# Patient Record
Sex: Male | Born: 1975 | Race: Black or African American | Hispanic: No | Marital: Married | Smoking: Never smoker
Health system: Southern US, Community
[De-identification: ages and names within clinical notes are randomized; demographics above are authoritative.]

## PROBLEM LIST (undated history)

## (undated) DIAGNOSIS — K219 Gastro-esophageal reflux disease without esophagitis: Secondary | ICD-10-CM

## (undated) DIAGNOSIS — R7302 Impaired glucose tolerance (oral): Secondary | ICD-10-CM

## (undated) DIAGNOSIS — M545 Low back pain, unspecified: Secondary | ICD-10-CM

## (undated) DIAGNOSIS — E538 Deficiency of other specified B group vitamins: Secondary | ICD-10-CM

## (undated) HISTORY — DX: Impaired glucose tolerance (oral): R73.02

## (undated) HISTORY — DX: Low back pain, unspecified: M54.50

## (undated) HISTORY — DX: Deficiency of other specified B group vitamins: E53.8

## (undated) HISTORY — DX: Gastro-esophageal reflux disease without esophagitis: K21.9

## (undated) HISTORY — DX: Low back pain: M54.5

---

## 2006-04-05 ENCOUNTER — Ambulatory Visit: Payer: Self-pay | Admitting: Internal Medicine

## 2006-04-05 LAB — CONVERTED CEMR LAB
ALT: 34 units/L (ref 0–40)
AST: 22 units/L (ref 0–37)
Albumin: 4.4 g/dL (ref 3.5–5.2)
Alkaline Phosphatase: 40 units/L (ref 39–117)
BUN: 9 mg/dL (ref 6–23)
Basophils Absolute: 0 10*3/uL (ref 0.0–0.1)
Basophils Relative: 0.8 % (ref 0.0–1.0)
Bilirubin Urine: NEGATIVE
CO2: 29 meq/L (ref 19–32)
Calcium: 9.7 mg/dL (ref 8.4–10.5)
Chloride: 105 meq/L (ref 96–112)
Chol/HDL Ratio, serum: 3.5
Cholesterol: 154 mg/dL (ref 0–200)
Creatinine, Ser: 1.1 mg/dL (ref 0.4–1.5)
Eosinophil percent: 2.3 % (ref 0.0–5.0)
GFR calc non Af Amer: 83 mL/min
Glomerular Filtration Rate, Af Am: 100 mL/min/{1.73_m2}
Glucose, Bld: 103 mg/dL — ABNORMAL HIGH (ref 70–99)
HCT: 44.5 % (ref 39.0–52.0)
HDL: 43.9 mg/dL (ref >39.0)
Hemoglobin, Urine: NEGATIVE
Hemoglobin: 14.6 g/dL (ref 13.0–17.0)
Ketones, ur: NEGATIVE mg/dL
LDL Cholesterol: 101 mg/dL — ABNORMAL HIGH (ref 0–99)
Leukocytes, UA: NEGATIVE
Lymphocytes Relative: 43.7 % (ref 12.0–46.0)
MCHC: 32.7 g/dL (ref 30.0–36.0)
MCV: 86.6 fL (ref 78.0–100.0)
Monocytes Absolute: 0.3 10*3/uL (ref 0.2–0.7)
Monocytes Relative: 4.8 % (ref 3.0–11.0)
Neutro Abs: 2.6 10*3/uL (ref 1.4–7.7)
Neutrophils Relative %: 48.4 % (ref 43.0–77.0)
Nitrite: NEGATIVE
Platelets: 284 10*3/uL (ref 150–400)
Potassium: 4 meq/L (ref 3.5–5.1)
RBC: 5.14 M/uL (ref 4.22–5.81)
RDW: 12.4 % (ref 11.5–14.6)
Sodium: 139 meq/L (ref 135–145)
Specific Gravity, Urine: 1.02 (ref 1.000–1.03)
TSH: 1.05 microintl units/mL (ref 0.35–5.50)
Total Bilirubin: 1.1 mg/dL (ref 0.3–1.2)
Total Protein, Urine: NEGATIVE mg/dL
Total Protein: 7.6 g/dL (ref 6.0–8.3)
Triglyceride fasting, serum: 44 mg/dL (ref 0–149)
Urine Glucose: NEGATIVE mg/dL
Urobilinogen, UA: 1 (ref 0.0–1.0)
VLDL: 9 mg/dL (ref 0–40)
WBC: 5.4 10*3/uL (ref 4.5–10.5)
pH: 7.5 (ref 5.0–8.0)

## 2006-04-25 ENCOUNTER — Ambulatory Visit: Payer: Self-pay | Admitting: Internal Medicine

## 2007-03-12 ENCOUNTER — Encounter: Payer: Self-pay | Admitting: Internal Medicine

## 2007-03-12 DIAGNOSIS — M545 Low back pain, unspecified: Secondary | ICD-10-CM | POA: Insufficient documentation

## 2007-03-12 DIAGNOSIS — J309 Allergic rhinitis, unspecified: Secondary | ICD-10-CM | POA: Insufficient documentation

## 2007-05-17 ENCOUNTER — Ambulatory Visit: Payer: Self-pay | Admitting: Internal Medicine

## 2007-05-17 LAB — CONVERTED CEMR LAB
ALT: 46 units/L (ref 0–53)
AST: 28 units/L (ref 0–37)
Basophils Absolute: 0 10*3/uL (ref 0.0–0.1)
Basophils Relative: 0.2 % (ref 0.0–1.0)
Bilirubin Urine: NEGATIVE
CO2: 30 meq/L (ref 19–32)
Creatinine, Ser: 1 mg/dL (ref 0.4–1.5)
Eosinophils Absolute: 0.1 10*3/uL (ref 0.0–0.6)
Eosinophils Relative: 1.6 % (ref 0.0–5.0)
HCT: 42 % (ref 39.0–52.0)
HDL: 42.7 mg/dL (ref >39.0)
Hemoglobin: 14.5 g/dL (ref 13.0–17.0)
LDL Cholesterol: 93 mg/dL (ref 0–99)
Leukocytes, UA: NEGATIVE
Lymphocytes Relative: 39 % (ref 12.0–46.0)
MCHC: 34.6 g/dL (ref 30.0–36.0)
MCV: 86.3 fL (ref 78.0–100.0)
Monocytes Relative: 6 % (ref 3.0–11.0)
Neutrophils Relative %: 53.2 % (ref 43.0–77.0)
Platelets: 273 10*3/uL (ref 150–400)
RBC: 4.86 M/uL (ref 4.22–5.81)
Total CHOL/HDL Ratio: 3.4
Total Protein: 7.7 g/dL (ref 6.0–8.3)
VLDL: 11 mg/dL (ref 0–40)

## 2007-05-24 ENCOUNTER — Ambulatory Visit: Payer: Self-pay | Admitting: Internal Medicine

## 2007-05-24 DIAGNOSIS — N529 Male erectile dysfunction, unspecified: Secondary | ICD-10-CM

## 2007-05-24 DIAGNOSIS — R7309 Other abnormal glucose: Secondary | ICD-10-CM

## 2007-05-24 DIAGNOSIS — R209 Unspecified disturbances of skin sensation: Secondary | ICD-10-CM | POA: Insufficient documentation

## 2007-05-24 DIAGNOSIS — K219 Gastro-esophageal reflux disease without esophagitis: Secondary | ICD-10-CM | POA: Insufficient documentation

## 2007-07-06 ENCOUNTER — Telehealth: Payer: Self-pay | Admitting: Internal Medicine

## 2007-08-21 ENCOUNTER — Ambulatory Visit: Payer: Self-pay | Admitting: Internal Medicine

## 2007-08-24 LAB — CONVERTED CEMR LAB
CO2: 31 meq/L (ref 19–32)
Calcium: 9.6 mg/dL (ref 8.4–10.5)
Creatinine, Ser: 1 mg/dL (ref 0.4–1.5)
GFR calc Af Amer: 111 mL/min
Glucose, Bld: 79 mg/dL (ref 70–99)
H Pylori IgG: POSITIVE — AB
Potassium: 4.3 meq/L (ref 3.5–5.1)
Sodium: 141 meq/L (ref 135–145)

## 2007-09-11 ENCOUNTER — Ambulatory Visit: Payer: Self-pay | Admitting: Internal Medicine

## 2007-09-11 DIAGNOSIS — E291 Testicular hypofunction: Secondary | ICD-10-CM | POA: Insufficient documentation

## 2007-09-11 DIAGNOSIS — E538 Deficiency of other specified B group vitamins: Secondary | ICD-10-CM | POA: Insufficient documentation

## 2007-09-11 DIAGNOSIS — A048 Other specified bacterial intestinal infections: Secondary | ICD-10-CM | POA: Insufficient documentation

## 2007-10-11 ENCOUNTER — Telehealth: Payer: Self-pay | Admitting: Internal Medicine

## 2007-10-18 ENCOUNTER — Ambulatory Visit: Payer: Self-pay | Admitting: Internal Medicine

## 2007-10-18 DIAGNOSIS — E739 Lactose intolerance, unspecified: Secondary | ICD-10-CM

## 2007-10-18 DIAGNOSIS — L290 Pruritus ani: Secondary | ICD-10-CM | POA: Insufficient documentation

## 2007-10-18 DIAGNOSIS — M25539 Pain in unspecified wrist: Secondary | ICD-10-CM

## 2008-01-08 ENCOUNTER — Encounter: Payer: Self-pay | Admitting: Internal Medicine

## 2008-06-13 DIAGNOSIS — E538 Deficiency of other specified B group vitamins: Secondary | ICD-10-CM

## 2008-06-13 HISTORY — PX: FEMUR FRACTURE SURGERY: SHX633

## 2008-06-13 HISTORY — DX: Deficiency of other specified B group vitamins: E53.8

## 2008-08-11 ENCOUNTER — Telehealth: Payer: Self-pay | Admitting: Internal Medicine

## 2009-02-17 ENCOUNTER — Ambulatory Visit: Payer: Self-pay | Admitting: Internal Medicine

## 2009-02-17 DIAGNOSIS — R0609 Other forms of dyspnea: Secondary | ICD-10-CM

## 2009-02-17 DIAGNOSIS — R0989 Other specified symptoms and signs involving the circulatory and respiratory systems: Secondary | ICD-10-CM

## 2009-02-17 DIAGNOSIS — R42 Dizziness and giddiness: Secondary | ICD-10-CM

## 2009-02-18 ENCOUNTER — Ambulatory Visit: Payer: Self-pay | Admitting: Internal Medicine

## 2009-02-18 LAB — CONVERTED CEMR LAB
ALT: 37 units/L (ref 0–53)
BUN: 14 mg/dL (ref 6–23)
Basophils Absolute: 0 10*3/uL (ref 0.0–0.1)
Basophils Relative: 0.1 % (ref 0.0–3.0)
Bilirubin, Direct: 0.1 mg/dL (ref 0.0–0.3)
CO2: 30 meq/L (ref 19–32)
Cholesterol: 154 mg/dL (ref 0–200)
Creatinine, Ser: 1 mg/dL (ref 0.4–1.5)
Eosinophils Absolute: 0.2 10*3/uL (ref 0.0–0.7)
Eosinophils Relative: 2.9 % (ref 0.0–5.0)
HCT: 45.5 % (ref 39.0–52.0)
Hemoglobin: 15.3 g/dL (ref 13.0–17.0)
Lymphocytes Relative: 43.6 % (ref 12.0–46.0)
MCHC: 33.7 g/dL (ref 30.0–36.0)
MCV: 88.6 fL (ref 78.0–100.0)
Monocytes Absolute: 0.3 10*3/uL (ref 0.1–1.0)
Neutrophils Relative %: 47.4 % (ref 43.0–77.0)
RBC: 5.14 M/uL (ref 4.22–5.81)
Sodium: 141 meq/L (ref 135–145)
VLDL: 9.4 mg/dL (ref 0.0–40.0)
Vitamin B-12: 214 pg/mL (ref 211–911)
WBC: 5.7 10*3/uL (ref 4.5–10.5)

## 2009-02-24 ENCOUNTER — Ambulatory Visit: Payer: Self-pay | Admitting: Pulmonary Disease

## 2009-02-24 DIAGNOSIS — G4730 Sleep apnea, unspecified: Secondary | ICD-10-CM

## 2009-02-24 DIAGNOSIS — G4710 Hypersomnia, unspecified: Secondary | ICD-10-CM

## 2009-03-04 ENCOUNTER — Ambulatory Visit (HOSPITAL_BASED_OUTPATIENT_CLINIC_OR_DEPARTMENT_OTHER): Admission: RE | Admit: 2009-03-04 | Discharge: 2009-03-04 | Payer: Self-pay | Admitting: Pulmonary Disease

## 2009-03-04 ENCOUNTER — Encounter: Payer: Self-pay | Admitting: Pulmonary Disease

## 2009-03-05 ENCOUNTER — Ambulatory Visit: Payer: Self-pay | Admitting: Pulmonary Disease

## 2009-03-20 ENCOUNTER — Ambulatory Visit: Payer: Self-pay | Admitting: Pulmonary Disease

## 2009-03-26 ENCOUNTER — Telehealth: Payer: Self-pay | Admitting: Pulmonary Disease

## 2009-04-04 ENCOUNTER — Inpatient Hospital Stay (HOSPITAL_COMMUNITY): Admission: EM | Admit: 2009-04-04 | Discharge: 2009-04-06 | Payer: Self-pay | Admitting: Emergency Medicine

## 2009-04-10 ENCOUNTER — Telehealth (INDEPENDENT_AMBULATORY_CARE_PROVIDER_SITE_OTHER): Payer: Self-pay | Admitting: *Deleted

## 2009-04-13 ENCOUNTER — Telehealth (INDEPENDENT_AMBULATORY_CARE_PROVIDER_SITE_OTHER): Payer: Self-pay | Admitting: *Deleted

## 2009-04-14 ENCOUNTER — Encounter: Payer: Self-pay | Admitting: Pulmonary Disease

## 2009-07-10 ENCOUNTER — Ambulatory Visit: Payer: Self-pay | Admitting: Internal Medicine

## 2009-07-10 DIAGNOSIS — M25519 Pain in unspecified shoulder: Secondary | ICD-10-CM

## 2009-07-24 ENCOUNTER — Telehealth: Payer: Self-pay | Admitting: Internal Medicine

## 2009-07-27 ENCOUNTER — Encounter: Payer: Self-pay | Admitting: Internal Medicine

## 2009-07-27 ENCOUNTER — Telehealth: Payer: Self-pay | Admitting: Internal Medicine

## 2009-08-04 ENCOUNTER — Ambulatory Visit: Payer: Self-pay | Admitting: Internal Medicine

## 2009-08-04 DIAGNOSIS — M7520 Bicipital tendinitis, unspecified shoulder: Secondary | ICD-10-CM | POA: Insufficient documentation

## 2009-08-04 DIAGNOSIS — E559 Vitamin D deficiency, unspecified: Secondary | ICD-10-CM | POA: Insufficient documentation

## 2009-08-04 DIAGNOSIS — M67919 Unspecified disorder of synovium and tendon, unspecified shoulder: Secondary | ICD-10-CM | POA: Insufficient documentation

## 2009-08-04 DIAGNOSIS — Z87891 Personal history of nicotine dependence: Secondary | ICD-10-CM | POA: Insufficient documentation

## 2009-08-04 DIAGNOSIS — M719 Bursopathy, unspecified: Secondary | ICD-10-CM

## 2009-08-04 LAB — CONVERTED CEMR LAB
BUN: 17 mg/dL (ref 6–23)
Sed Rate: 6 mm/hr (ref 0–22)
TSH: 1.17 microintl units/mL (ref 0.35–5.50)
Vitamin B-12: 242 pg/mL (ref 211–911)

## 2010-03-19 ENCOUNTER — Telehealth: Payer: Self-pay | Admitting: Pulmonary Disease

## 2010-04-23 ENCOUNTER — Ambulatory Visit: Payer: Self-pay | Admitting: Pulmonary Disease

## 2010-07-13 NOTE — Progress Notes (Signed)
Summary: Celebrex PA  Phone Note From Pharmacy   Caller: [facility] [phone] Call For: ID:  [id number]  Summary of Call: PA request--Celebrex. Usually this med is denied unless the patient has tried and failed 2 NSAIDs at prescription strentgth. Please advise. Initial call taken by: Lucious Groves,  July 27, 2009 10:56 AM  Follow-up for Phone Call        I was able to do PA online and it is approved until 07/27/2010. Follow-up by: Lucious Groves,  July 27, 2009 1:30 PM  Additional Follow-up for Phone Call Additional follow up Details #1::        Great - Thx! Additional Follow-up by: Tresa Garter MD,  July 27, 2009 3:38 PM

## 2010-07-13 NOTE — Miscellaneous (Signed)
Summary: Shoulder Inj,AC jointing,trigger point inj/[facility]  Shoulder Inj,AC jointing,trigger point inj/[facility]   Imported By: Sherian Rein 08/06/2009 09:43:21  _____________________________________________________________________  External Attachment:    Type:   Image     Comment:   External Document
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
## 2010-07-13 NOTE — Progress Notes (Signed)
Summary: celebrex refill  Phone Note Refill Request Message from:  Patient on July 24, 2009 10:48 AM  Refills Requested: Medication #1:  CELEBREX 200 MG CAPS One by mouth once daily for pain. Initial call taken by: Rock Nephew CMA,  July 24, 2009 10:48 AM    Prescriptions: CELEBREX 200 MG CAPS (CELECOXIB) One by mouth once daily for pain  #30 x 0   Entered by:   Rock Nephew CMA   Authorized by:   Etta Grandchild MD   Signed by:   Rock Nephew CMA on 07/24/2009   Method used:   Electronically to        [facility]  [address] [phone]* (retail)       [address]       [place], [place]  [id number]       Ph: [phone] or [phone]       Fax: [phone]   RxID:   [id number]

## 2010-07-13 NOTE — Progress Notes (Signed)
Summary: ORDER for larger cpap mask  Phone Note Call from Patient   Caller: Patient Call For: Robert Bridges Summary of Call: PT NEED ORDER TO GET LARGER C PAP MASK SENT TO [facility]. Initial call taken by: Rickard Patience,  March 19, 2010 2:29 PM  Follow-up for Phone Call        called and spoke with pt.  pt states his current mask is leaking and states he believes his "face has gotten bigger."  Pt requests an rx for a larger mask.  Pt hasn't seen RA since Mar 20 2009.  Informed pt he is overdue for a f/u appt.  Pt scheduled an appt with RA for 04/23/2010 at 2:15pm.  Will forward message to RA to see if order can be sent to [facility] for a larger mask.  Aundra Millet Reynolds LPN  March 19, 2010 3:15 PM      Appended Document: ORDER for larger cpap mask Faxed order to [facility] with note on order that pt must keep 04/23/10 appt before any additional supplies can be approved.

## 2010-07-13 NOTE — Medication Information (Signed)
Summary: Celebrex Approved/[facility]  Celebrex Approved/[facility]   Imported By: Sherian Rein 08/05/2009 07:48:51  _____________________________________________________________________  External Attachment:    Type:   Image     Comment:   External Document

## 2010-09-16 LAB — URINALYSIS, ROUTINE W REFLEX MICROSCOPIC
Hgb urine dipstick: NEGATIVE
Ketones, ur: NEGATIVE mg/dL
Specific Gravity, Urine: 1.026 (ref 1.005–1.030)
pH: 6 (ref 5.0–8.0)

## 2010-09-16 LAB — POCT I-STAT, CHEM 8
Chloride: 104 mEq/L (ref 96–112)
Creatinine, Ser: 1.2 mg/dL (ref 0.4–1.5)
HCT: 44 % (ref 39.0–52.0)
Potassium: 3.5 mEq/L (ref 3.5–5.1)
TCO2: 24 mmol/L (ref 0–100)

## 2010-09-16 LAB — BASIC METABOLIC PANEL
BUN: 8 mg/dL (ref 6–23)
CO2: 26 mEq/L (ref 19–32)
Chloride: 103 mEq/L (ref 96–112)
GFR calc non Af Amer: >60 mL/min (ref >60)
Potassium: 4.3 mEq/L (ref 3.5–5.1)

## 2010-09-16 LAB — CBC
Hemoglobin: 13.3 g/dL (ref 13.0–17.0)
Platelets: 262 10*3/uL (ref 150–400)
Platelets: 278 10*3/uL (ref 150–400)
RBC: 4.61 MIL/uL (ref 4.22–5.81)
RDW: 13.1 % (ref 11.5–15.5)
RDW: 13.5 % (ref 11.5–15.5)

## 2010-09-16 LAB — DIFFERENTIAL
Basophils Absolute: 0 K/uL (ref 0.0–0.1)
Basophils Relative: 0 % (ref 0–1)
Eosinophils Absolute: 0.1 K/uL (ref 0.0–0.7)
Eosinophils Relative: 1 % (ref 0–5)
Lymphocytes Relative: 25 % (ref 12–46)
Lymphs Abs: 3.1 K/uL (ref 0.7–4.0)
Monocytes Absolute: 0.5 K/uL (ref 0.1–1.0)
Monocytes Relative: 4 % (ref 3–12)
Neutro Abs: 8.5 K/uL — ABNORMAL HIGH (ref 1.7–7.7)
Neutrophils Relative %: 69 % (ref 43–77)

## 2010-09-16 LAB — PROTIME-INR
INR: 1 (ref 0.00–1.49)
INR: 1.05 (ref 0.00–1.49)
INR: 1.13 (ref 0.00–1.49)
Prothrombin Time: 13.1 s (ref 11.6–15.2)
Prothrombin Time: 13.6 s (ref 11.6–15.2)
Prothrombin Time: 14.4 s (ref 11.6–15.2)

## 2010-09-16 LAB — TYPE AND SCREEN
ABO/RH(D): O POS
Antibody Screen: NEGATIVE

## 2010-10-29 NOTE — Assessment & Plan Note (Signed)
[facility]                             PRIMARY CARE OFFICE NOTE   DAYSON, ABOUD                      MRN:          [id number]  DATE:04/25/2006                            DOB:          Nov 28, 1975    The patient is a 35 year old male who presents to reestablish primary.  He  is a new patient and has not been seen in over three years.  Past medical  history, family history and social history as per April 11, 2001.  Now, he  has two sons, ages 30 and 31.  He had a surgery for a turned patella last May  by Dr. Eulah Pont.  It was a basketball injury.   ALLERGIES:  None.   MEDICATIONS:  None.   REVIEW OF SYSTEMS:  No chest pain or shortness of breath.  No syncope, no  dry mouth.  Wife is concerned about him snoring loudly and possibly having  apneic episodes.  The complete review of systems is normal otherwise.  Has  nasal congestion chronic.   PHYSICAL EXAMINATION:  Blood pressure 130/77, pulse 89, temperature 98.5,  weight 263 pounds.  He looks well.  He is in no acute distress.  HEENT was  moist nasal mucosa.  Neck supple.  Tonsils are absent.  No lymphadenopathy.  Lungs clear.  No wheezes or rales.  Heart S1-S2.  No murmur and no gallop.  Abdomen is soft and nontender.  No masses palpable.  Lower extremities  without edema.  He is alert, oriented and cooperative.  Denies being  depressed.  Urogenital self exam is normal.  Skin clear.   LABS:  On April 05, 2006, CBC normal.  TSH normal.  Cholesterol 164, LDL  normal.  CMET normal.  Glucose 103.  EKG today was normal sinus rhythm.   ASSESSMENT/PLAN:  1. Normal wellness examination. Age/health-related issues discussed.      Healthy lifestyle discussed.  I will repeat the exam in 12 months.  2. Slight elevated glucose:  Will repeat in 6 months.  Lose weight and      exercise.  3. Possible obstructive sleep apnea/snoring:  Likely related to his upper      airways.  I give him Nasacort to use  two sprays in each nostril daily      and Allegra D 24 hours to take one in the morning and seek consult with      Dr. Pollyann Kennedy.  Sleep test at Dr. Lucky Rathke discretion.     Georgina Quint. Plotnikov, MD  Electronically Signed    AVP/MedQ  DD: 04/25/2006  DT: 04/26/2006  Job #: [id number]   cc:   Enrigue Catena H. Pollyann Kennedy, MD
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
## 2010-12-06 ENCOUNTER — Encounter: Payer: Self-pay | Admitting: Internal Medicine

## 2010-12-07 ENCOUNTER — Encounter: Payer: Self-pay | Admitting: Internal Medicine

## 2010-12-07 ENCOUNTER — Ambulatory Visit: Payer: Self-pay | Admitting: Internal Medicine

## 2010-12-08 ENCOUNTER — Encounter: Payer: Self-pay | Admitting: Internal Medicine

## 2010-12-08 ENCOUNTER — Other Ambulatory Visit (INDEPENDENT_AMBULATORY_CARE_PROVIDER_SITE_OTHER): Payer: BC Managed Care – PPO

## 2010-12-08 ENCOUNTER — Telehealth: Payer: Self-pay | Admitting: Internal Medicine

## 2010-12-08 ENCOUNTER — Ambulatory Visit (INDEPENDENT_AMBULATORY_CARE_PROVIDER_SITE_OTHER): Payer: BC Managed Care – PPO | Admitting: Internal Medicine

## 2010-12-08 DIAGNOSIS — Z9989 Dependence on other enabling machines and devices: Secondary | ICD-10-CM

## 2010-12-08 DIAGNOSIS — E291 Testicular hypofunction: Secondary | ICD-10-CM

## 2010-12-08 DIAGNOSIS — R209 Unspecified disturbances of skin sensation: Secondary | ICD-10-CM

## 2010-12-08 DIAGNOSIS — R5383 Other fatigue: Secondary | ICD-10-CM

## 2010-12-08 DIAGNOSIS — G4710 Hypersomnia, unspecified: Secondary | ICD-10-CM

## 2010-12-08 DIAGNOSIS — R5381 Other malaise: Secondary | ICD-10-CM

## 2010-12-08 DIAGNOSIS — Z5689 Other problems related to employment: Secondary | ICD-10-CM

## 2010-12-08 DIAGNOSIS — G4733 Obstructive sleep apnea (adult) (pediatric): Secondary | ICD-10-CM

## 2010-12-08 DIAGNOSIS — Z566 Other physical and mental strain related to work: Secondary | ICD-10-CM | POA: Insufficient documentation

## 2010-12-08 DIAGNOSIS — G4730 Sleep apnea, unspecified: Secondary | ICD-10-CM

## 2010-12-08 LAB — COMPREHENSIVE METABOLIC PANEL
AST: 27 U/L (ref 0–37)
Albumin: 4.8 g/dL (ref 3.5–5.2)
BUN: 12 mg/dL (ref 6–23)
CO2: 25 mEq/L (ref 19–32)
Calcium: 9.4 mg/dL (ref 8.4–10.5)
Chloride: 105 mEq/L (ref 96–112)
Creatinine, Ser: 1 mg/dL (ref 0.4–1.5)
GFR: 103.98 mL/min (ref >60.00)
Potassium: 3.9 mEq/L (ref 3.5–5.1)

## 2010-12-08 LAB — CBC WITH DIFFERENTIAL/PLATELET
Basophils Absolute: 0 10*3/uL (ref 0.0–0.1)
Basophils Relative: 0.4 % (ref 0.0–3.0)
Eosinophils Absolute: 0 10*3/uL (ref 0.0–0.7)
Hemoglobin: 14.3 g/dL (ref 13.0–17.0)
Lymphocytes Relative: 26.2 % (ref 12.0–46.0)
MCHC: 34.3 g/dL (ref 30.0–36.0)
Monocytes Relative: 5.8 % (ref 3.0–12.0)
Neutrophils Relative %: 67 % (ref 43.0–77.0)
RBC: 4.75 Mil/uL (ref 4.22–5.81)
RDW: 13.4 % (ref 11.5–14.6)

## 2010-12-08 LAB — VITAMIN B12: Vitamin B-12: 156 pg/mL — ABNORMAL LOW (ref 211–911)

## 2010-12-08 LAB — FOLLICLE STIMULATING HORMONE: FSH: 7.1 m[IU]/mL (ref 1.4–18.1)

## 2010-12-08 MED ORDER — VITAMIN B-12 1000 MCG SL SUBL: 1.0000 | SUBLINGUAL_TABLET | Freq: Every day | SUBLINGUAL | Status: DC

## 2010-12-08 MED ORDER — MELOXICAM 15 MG PO TABS: ORAL_TABLET | ORAL | Status: DC

## 2010-12-08 MED ORDER — VITAMIN D 1000 UNITS PO TABS: 1000.0000 [IU] | ORAL_TABLET | Freq: Every day | ORAL | Status: DC

## 2010-12-08 MED ORDER — NORTRIPTYLINE HCL 25 MG PO CAPS: 25.0000 mg | ORAL_CAPSULE | Freq: Every day | ORAL | Status: DC

## 2010-12-08 NOTE — Assessment & Plan Note (Signed)
Check labs 

## 2010-12-08 NOTE — Assessment & Plan Note (Signed)
Cont Rx 

## 2010-12-08 NOTE — Assessment & Plan Note (Signed)
Check labs. Likely a CTS Splint Labs

## 2010-12-08 NOTE — Assessment & Plan Note (Signed)
Discussed.

## 2010-12-08 NOTE — Telephone Encounter (Signed)
Pt informed. OV scheduled.

## 2010-12-08 NOTE — Assessment & Plan Note (Signed)
Check Testost level

## 2010-12-08 NOTE — Progress Notes (Signed)
  Subjective:    Patient ID: Robert Bridges, male    DOB: Mar 18, 1976, 35 y.o.   MRN: [id number]  HPI   C/o R hand off and on tingling and twitching x 2 wks. C/o stress at work. C/o restless sleep. C/o depression. C/o anxiety, tearfulness. His sx's been severe. Worse lately. Review of Systems  Constitutional: Negative for appetite change, fatigue and unexpected weight change.  HENT: Negative for nosebleeds, congestion, sore throat, sneezing, trouble swallowing and neck pain.   Eyes: Negative for itching and visual disturbance.  Respiratory: Negative for cough.   Cardiovascular: Negative for chest pain, palpitations and leg swelling.  Gastrointestinal: Negative for nausea, diarrhea, blood in stool and abdominal distention.  Genitourinary: Negative for frequency and hematuria.  Musculoskeletal: Negative for back pain, joint swelling and gait problem.  Skin: Negative for rash.  Neurological: Negative for dizziness, tremors, speech difficulty and weakness.  Psychiatric/Behavioral: Negative for suicidal ideas, sleep disturbance, dysphoric mood and agitation. The patient is nervous/anxious (stress at work).        Depressed       Objective:   Physical Exam  Constitutional: He is oriented to person, place, and time. He appears well-developed.  HENT:  Mouth/Throat: Oropharynx is clear and moist.  Eyes: Conjunctivae are normal. Pupils are equal, round, and reactive to light.  Neck: Normal range of motion. No JVD present. No thyromegaly present.  Cardiovascular: Normal rate, regular rhythm, normal heart sounds and intact distal pulses.  Exam reveals no gallop and no friction rub.   No murmur heard. Pulmonary/Chest: Effort normal and breath sounds normal. No respiratory distress. He has no wheezes. He has no rales. He exhibits no tenderness.  Abdominal: Soft. Bowel sounds are normal. He exhibits no distension and no mass. There is no tenderness. There is no rebound and no guarding.    Musculoskeletal: Normal range of motion. He exhibits no edema and no tenderness.  Lymphadenopathy:    He has no cervical adenopathy.  Neurological: He is alert and oriented to person, place, and time. He has normal reflexes. No cranial nerve deficit. He exhibits normal muscle tone. Coordination normal.  Skin: Skin is warm and dry. No rash noted.  Psychiatric: His behavior is normal. Judgment and thought content normal.       Anxious         Wt Readings from Last 3 Encounters:  12/08/10 277 lb (125.646 kg)  04/23/10 286 lb (129.729 kg)  08/04/09 275 lb (124.739 kg)     Assessment & Plan:

## 2010-12-08 NOTE — Telephone Encounter (Signed)
Robert Bridges, please, inform patient that all labs are normal except for low Vit B12 and Testosterone. OV on Fri to start shots pls Thx

## 2010-12-08 NOTE — Assessment & Plan Note (Signed)
Cont w/CPAP 

## 2010-12-10 ENCOUNTER — Ambulatory Visit (INDEPENDENT_AMBULATORY_CARE_PROVIDER_SITE_OTHER): Payer: BC Managed Care – PPO | Admitting: Internal Medicine

## 2010-12-10 ENCOUNTER — Encounter: Payer: Self-pay | Admitting: Internal Medicine

## 2010-12-10 DIAGNOSIS — E559 Vitamin D deficiency, unspecified: Secondary | ICD-10-CM

## 2010-12-10 DIAGNOSIS — E291 Testicular hypofunction: Secondary | ICD-10-CM

## 2010-12-10 DIAGNOSIS — E538 Deficiency of other specified B group vitamins: Secondary | ICD-10-CM

## 2010-12-10 MED ORDER — TESTOSTERONE 20.25 MG/ACT (1.62%) TD GEL: 2.0000 | TRANSDERMAL | Status: DC

## 2010-12-10 MED ORDER — VITAMIN B-12 1000 MCG SL SUBL: 1.0000 | SUBLINGUAL_TABLET | Freq: Every day | SUBLINGUAL | Status: DC

## 2010-12-10 MED ORDER — ERGOCALCIFEROL 1.25 MG (50000 UT) PO CAPS: 50000.0000 [IU] | ORAL_CAPSULE | ORAL | Status: DC

## 2010-12-10 MED ORDER — CYANOCOBALAMIN 1000 MCG/ML IJ SOLN
1000.0000 ug | Freq: Once | INTRAMUSCULAR | Status: AC
  Administered 2010-12-10: 1000 ug via INTRAMUSCULAR

## 2010-12-10 NOTE — Patient Instructions (Signed)
[email or web address] 

## 2010-12-10 NOTE — Assessment & Plan Note (Signed)
Start Rx 

## 2010-12-10 NOTE — Assessment & Plan Note (Signed)
  Start Rx  Potential benefits of a long term testosterone  use as well as potential risks  and complications were explained to the patient and were aknowledged.

## 2010-12-10 NOTE — Assessment & Plan Note (Signed)
Risks associated with treatment noncompliance were discussed. Compliance was encouraged. 

## 2010-12-13 ENCOUNTER — Ambulatory Visit: Payer: BC Managed Care – PPO | Admitting: Internal Medicine

## 2010-12-13 NOTE — Progress Notes (Signed)
  Subjective:    Patient ID: Robert Bridges, male    DOB: 01-15-1976, 35 y.o.   MRN: [id number]  HPI     Review of Systems  Constitutional: Negative for appetite change, fatigue and unexpected weight change.  HENT: Negative for nosebleeds, congestion, sore throat, sneezing, trouble swallowing and neck pain.   Eyes: Negative for itching and visual disturbance.  Respiratory: Negative for cough.   Cardiovascular: Negative for chest pain, palpitations and leg swelling.  Gastrointestinal: Negative for nausea, diarrhea, blood in stool and abdominal distention.  Genitourinary: Negative for frequency and hematuria.  Musculoskeletal: Negative for back pain, joint swelling and gait problem.  Skin: Negative for rash.  Neurological: Negative for dizziness, tremors, speech difficulty and weakness.  Psychiatric/Behavioral: Negative for suicidal ideas, sleep disturbance, dysphoric mood and agitation. The patient is nervous/anxious (stress at work).        Depressed       Objective:   Physical Exam  Constitutional: He is oriented to person, place, and time. He appears well-developed.  HENT:  Mouth/Throat: Oropharynx is clear and moist.  Eyes: Conjunctivae are normal. Pupils are equal, round, and reactive to light.  Neck: Normal range of motion. No JVD present. No thyromegaly present.  Cardiovascular: Normal rate, regular rhythm, normal heart sounds and intact distal pulses.  Exam reveals no gallop and no friction rub.   No murmur heard. Pulmonary/Chest: Effort normal and breath sounds normal. No respiratory distress. He has no wheezes. He has no rales. He exhibits no tenderness.  Abdominal: Soft. Bowel sounds are normal. He exhibits no distension and no mass. There is no tenderness. There is no rebound and no guarding.  Musculoskeletal: Normal range of motion. He exhibits no edema and no tenderness.  Lymphadenopathy:    He has no cervical adenopathy.  Neurological: He is alert and oriented to  person, place, and time. He has normal reflexes. No cranial nerve deficit. He exhibits normal muscle tone. Coordination normal.  Skin: Skin is warm and dry. No rash noted.  Psychiatric: His behavior is normal. Judgment and thought content normal.       Anxious         Wt Readings from Last 3 Encounters:  12/08/10 277 lb (125.646 kg)  04/23/10 286 lb (129.729 kg)  08/04/09 275 lb (124.739 kg)     Assessment & Plan:  A complex case. Discussed w/pt and his wife  A complex  Case - discussed w/pt abd his wife

## 2010-12-17 ENCOUNTER — Encounter: Payer: Self-pay | Admitting: Internal Medicine

## 2010-12-17 ENCOUNTER — Ambulatory Visit (INDEPENDENT_AMBULATORY_CARE_PROVIDER_SITE_OTHER): Payer: BC Managed Care – PPO | Admitting: Internal Medicine

## 2010-12-17 VITALS — BP 140/80 | HR 88 | Temp 98.4°F | Resp 16

## 2010-12-17 DIAGNOSIS — K645 Perianal venous thrombosis: Secondary | ICD-10-CM

## 2010-12-17 DIAGNOSIS — E291 Testicular hypofunction: Secondary | ICD-10-CM

## 2010-12-17 MED ORDER — LIDOCAINE-HYDROCORTISONE ACE 3-0.5 % RE CREA: 1.0000 | TOPICAL_CREAM | Freq: Four times a day (QID) | RECTAL | Status: DC

## 2010-12-17 MED ORDER — TESTOSTERONE MICRONIZED CRYS: CRYSTALS | Status: DC

## 2010-12-17 NOTE — Progress Notes (Signed)
  Subjective:    Patient ID: Robert Bridges, male    DOB: 1975/11/04, 35 y.o.   MRN: [id number]  HPI     Review of Systems  Constitutional: Negative for appetite change, fatigue and unexpected weight change.  HENT: Negative for nosebleeds, congestion, sore throat, sneezing, trouble swallowing and neck pain.   Eyes: Negative for itching and visual disturbance.  Respiratory: Negative for cough.   Cardiovascular: Negative for chest pain, palpitations and leg swelling.  Gastrointestinal: Positive for anal bleeding and rectal pain. Negative for nausea, diarrhea, blood in stool and abdominal distention.  Genitourinary: Negative for frequency and hematuria.  Musculoskeletal: Negative for back pain, joint swelling and gait problem.  Skin: Negative for rash.  Neurological: Negative for dizziness, tremors, speech difficulty and weakness.  Psychiatric/Behavioral: Negative for suicidal ideas, sleep disturbance, dysphoric mood and agitation. The patient is nervous/anxious (stress at work).        Depressed       Objective:   Physical Exam  Constitutional: He is oriented to person, place, and time. He appears well-developed.  HENT:  Mouth/Throat: Oropharynx is clear and moist.  Eyes: Conjunctivae are normal. Pupils are equal, round, and reactive to light.  Neck: Normal range of motion. No JVD present. No thyromegaly present.  Cardiovascular: Normal rate, regular rhythm, normal heart sounds and intact distal pulses.  Exam reveals no gallop and no friction rub.   No murmur heard. Pulmonary/Chest: Effort normal and breath sounds normal. No respiratory distress. He has no wheezes. He has no rales. He exhibits no tenderness.  Abdominal: Soft. Bowel sounds are normal. He exhibits no distension and no mass. There is no tenderness. There is no rebound and no guarding.  Genitourinary:       1 cm thrombosed hemorrhoid at 7 o'clock, ruptured top No abscess  Musculoskeletal: Normal range of motion. He  exhibits no edema and no tenderness.  Lymphadenopathy:    He has no cervical adenopathy.  Neurological: He is alert and oriented to person, place, and time. He has normal reflexes. No cranial nerve deficit. He exhibits normal muscle tone. Coordination normal.  Skin: Skin is warm and dry. No rash noted.  Psychiatric: His behavior is normal. Judgment and thought content normal.       Anxious            Assessment & Plan:  A complex case. Discussed w/pt and his wife

## 2010-12-19 ENCOUNTER — Encounter: Payer: Self-pay | Admitting: Internal Medicine

## 2010-12-19 NOTE — Assessment & Plan Note (Signed)
Androgel is too $$$ Start a compound gel

## 2010-12-31 ENCOUNTER — Ambulatory Visit: Payer: BC Managed Care – PPO | Admitting: Licensed Clinical Social Worker

## 2011-01-17 ENCOUNTER — Ambulatory Visit (INDEPENDENT_AMBULATORY_CARE_PROVIDER_SITE_OTHER): Payer: BC Managed Care – PPO | Admitting: Internal Medicine

## 2011-01-17 ENCOUNTER — Encounter: Payer: Self-pay | Admitting: Internal Medicine

## 2011-01-17 DIAGNOSIS — Z566 Other physical and mental strain related to work: Secondary | ICD-10-CM

## 2011-01-17 DIAGNOSIS — E291 Testicular hypofunction: Secondary | ICD-10-CM

## 2011-01-17 DIAGNOSIS — R5381 Other malaise: Secondary | ICD-10-CM

## 2011-01-17 DIAGNOSIS — R5383 Other fatigue: Secondary | ICD-10-CM

## 2011-01-17 DIAGNOSIS — E538 Deficiency of other specified B group vitamins: Secondary | ICD-10-CM

## 2011-01-17 DIAGNOSIS — Z5689 Other problems related to employment: Secondary | ICD-10-CM

## 2011-01-17 NOTE — Assessment & Plan Note (Signed)
Better now 

## 2011-01-17 NOTE — Progress Notes (Signed)
  Subjective:    Patient ID: Robert Bridges, male    DOB: 25-Aug-1975, 35 y.o.   MRN: [id number]  HPI   F/u depression, hypogonadism, B12 and D vit def, fatigue. Using CPAP. Feeling better...  Wt Readings from Last 3 Encounters:  01/17/11 282 lb (127.914 kg)  12/08/10 277 lb (125.646 kg)  04/23/10 286 lb (129.729 kg)   BP Readings from Last 3 Encounters:  01/17/11 148/90  12/17/10 140/80  12/10/10 150/92     Review of Systems  Constitutional: Positive for fatigue (better). Negative for appetite change and unexpected weight change.  HENT: Negative for nosebleeds, congestion, sore throat, sneezing, trouble swallowing and neck pain.   Eyes: Negative for itching and visual disturbance.  Respiratory: Negative for cough.   Cardiovascular: Negative for chest pain, palpitations and leg swelling.  Gastrointestinal: Negative for nausea, diarrhea, blood in stool and abdominal distention.  Genitourinary: Negative for frequency and hematuria.  Musculoskeletal: Negative for back pain, joint swelling and gait problem.  Skin: Negative for rash.  Neurological: Negative for dizziness, tremors, speech difficulty and weakness.  Psychiatric/Behavioral: Positive for dysphoric mood (better). Negative for suicidal ideas, sleep disturbance and agitation. The patient is nervous/anxious (better).        Objective:   Physical Exam  Constitutional: He is oriented to person, place, and time. He appears well-developed.       Obese   HENT:  Mouth/Throat: Oropharynx is clear and moist.  Eyes: Conjunctivae are normal. Pupils are equal, round, and reactive to light.  Neck: Normal range of motion. No JVD present. No thyromegaly present.  Cardiovascular: Normal rate, regular rhythm, normal heart sounds and intact distal pulses.  Exam reveals no gallop and no friction rub.   No murmur heard. Pulmonary/Chest: Effort normal and breath sounds normal. No respiratory distress. He has no wheezes. He has no rales. He  exhibits no tenderness.  Abdominal: Soft. Bowel sounds are normal. He exhibits no distension and no mass. There is no tenderness. There is no rebound and no guarding.  Musculoskeletal: Normal range of motion. He exhibits no edema and no tenderness.  Lymphadenopathy:    He has no cervical adenopathy.  Neurological: He is alert and oriented to person, place, and time. He has normal reflexes. No cranial nerve deficit. He exhibits normal muscle tone. Coordination normal.  Skin: Skin is warm and dry. No rash noted.  Psychiatric: He has a normal mood and affect. His behavior is normal. Judgment and thought content normal.          Assessment & Plan:

## 2011-01-17 NOTE — Assessment & Plan Note (Signed)
On Rx 

## 2011-01-17 NOTE — Assessment & Plan Note (Signed)
Discussed.

## 2011-03-21 ENCOUNTER — Other Ambulatory Visit: Payer: Self-pay | Admitting: Internal Medicine

## 2011-04-15 ENCOUNTER — Ambulatory Visit (INDEPENDENT_AMBULATORY_CARE_PROVIDER_SITE_OTHER): Payer: BC Managed Care – PPO | Admitting: Internal Medicine

## 2011-04-15 ENCOUNTER — Encounter: Payer: Self-pay | Admitting: Internal Medicine

## 2011-04-15 DIAGNOSIS — Z566 Other physical and mental strain related to work: Secondary | ICD-10-CM

## 2011-04-15 DIAGNOSIS — R5383 Other fatigue: Secondary | ICD-10-CM

## 2011-04-15 DIAGNOSIS — E538 Deficiency of other specified B group vitamins: Secondary | ICD-10-CM

## 2011-04-15 DIAGNOSIS — E291 Testicular hypofunction: Secondary | ICD-10-CM

## 2011-04-15 DIAGNOSIS — N529 Male erectile dysfunction, unspecified: Secondary | ICD-10-CM

## 2011-04-15 DIAGNOSIS — R21 Rash and other nonspecific skin eruption: Secondary | ICD-10-CM | POA: Insufficient documentation

## 2011-04-15 DIAGNOSIS — Z5689 Other problems related to employment: Secondary | ICD-10-CM

## 2011-04-15 DIAGNOSIS — E559 Vitamin D deficiency, unspecified: Secondary | ICD-10-CM

## 2011-04-15 LAB — CBC WITH DIFFERENTIAL/PLATELET
Basophils Absolute: 0 10*3/uL (ref 0.0–0.1)
Eosinophils Absolute: 0.2 10*3/uL (ref 0.0–0.7)
Lymphocytes Relative: 31.3 % (ref 12.0–46.0)
MCHC: 33.8 g/dL (ref 30.0–36.0)
MCV: 87.7 fl (ref 78.0–100.0)
Monocytes Absolute: 0.3 10*3/uL (ref 0.1–1.0)
Neutrophils Relative %: 58.7 % (ref 43.0–77.0)
Platelets: 290 10*3/uL (ref 150.0–400.0)
RDW: 13.5 % (ref 11.5–14.6)

## 2011-04-15 LAB — TESTOSTERONE: Testosterone: 323.13 ng/dL — ABNORMAL LOW (ref 350.00–890.00)

## 2011-04-15 LAB — PSA: PSA: 0.66 ng/mL (ref 0.10–4.00)

## 2011-04-15 MED ORDER — MUPIROCIN 2 % EX OINT: TOPICAL_OINTMENT | CUTANEOUS | Status: AC

## 2011-04-15 MED ORDER — FEXOFENADINE-PSEUDOEPHED ER 180-240 MG PO TB24: 1.0000 | ORAL_TABLET | Freq: Every day | ORAL | Status: DC

## 2011-04-15 MED ORDER — SILDENAFIL CITRATE 100 MG PO TABS: 100.0000 mg | ORAL_TABLET | Freq: Every day | ORAL | Status: DC | PRN

## 2011-04-15 NOTE — Assessment & Plan Note (Signed)
Continue with current prescription therapy as reflected on the Med list.  

## 2011-04-15 NOTE — Progress Notes (Signed)
  Subjective:    Patient ID: Robert Bridges, male    DOB: 05/22/1976, 35 y.o.   MRN: [id number]  HPI The patient presents for a follow-up of  chronic hypertension, chronic dyslipidemia, type 2 diabetes controlled with medicines. C/o ED. F/u hypothyroidism.   Review of Systems  Constitutional: Negative for appetite change, fatigue and unexpected weight change.  HENT: Negative for nosebleeds, congestion, sore throat, sneezing, trouble swallowing and neck pain.   Eyes: Negative for itching and visual disturbance.  Respiratory: Negative for cough.   Cardiovascular: Negative for chest pain, palpitations and leg swelling.  Gastrointestinal: Negative for nausea, diarrhea, blood in stool and abdominal distention.  Genitourinary: Negative for frequency and hematuria.  Musculoskeletal: Negative for back pain, joint swelling and gait problem.  Skin: Negative for rash.  Neurological: Negative for dizziness, tremors, speech difficulty and weakness.  Psychiatric/Behavioral: Negative for sleep disturbance, dysphoric mood and agitation. The patient is not nervous/anxious.        Objective:   Physical Exam  Constitutional: He is oriented to person, place, and time. He appears well-developed.  HENT:  Mouth/Throat: Oropharynx is clear and moist.  Eyes: Conjunctivae are normal. Pupils are equal, round, and reactive to light.  Neck: Normal range of motion. No JVD present. No thyromegaly present.  Cardiovascular: Normal rate, regular rhythm, normal heart sounds and intact distal pulses.  Exam reveals no gallop and no friction rub.   No murmur heard. Pulmonary/Chest: Effort normal and breath sounds normal. No respiratory distress. He has no wheezes. He has no rales. He exhibits no tenderness (a patch of pustular rash on R shoulder).  Abdominal: Soft. Bowel sounds are normal. He exhibits no distension and no mass. There is no tenderness. There is no rebound and no guarding.  Musculoskeletal: Normal range of  motion. He exhibits no edema and no tenderness.  Lymphadenopathy:    He has no cervical adenopathy.  Neurological: He is alert and oriented to person, place, and time. He has normal reflexes. No cranial nerve deficit. He exhibits normal muscle tone. Coordination normal.  Skin: Skin is warm and dry. Rash noted.  Psychiatric: He has a normal mood and affect. His behavior is normal. Judgment and thought content normal.          Assessment & Plan:

## 2011-04-15 NOTE — Assessment & Plan Note (Signed)
Could be due to infection vs testosterone Start Mupirocin

## 2011-04-15 NOTE — Assessment & Plan Note (Signed)
Better 11/12

## 2011-04-16 LAB — VITAMIN D 25 HYDROXY (VIT D DEFICIENCY, FRACTURES): Vit D, 25-Hydroxy: 91 ng/mL — ABNORMAL HIGH (ref 30–89)

## 2011-04-17 ENCOUNTER — Telehealth: Payer: Self-pay | Admitting: Internal Medicine

## 2011-04-17 MED ORDER — VITAMIN D 1000 UNITS PO TABS: 1000.0000 [IU] | ORAL_TABLET | ORAL | Status: DC

## 2011-04-17 MED ORDER — TESTOSTERONE MICRONIZED CRYS: CRYSTALS | Status: DC

## 2011-04-17 MED ORDER — VITAMIN B-12 1000 MCG SL SUBL: 1.0000 | SUBLINGUAL_TABLET | SUBLINGUAL | Status: DC

## 2011-04-17 NOTE — Telephone Encounter (Signed)
Robert Bridges , please, inform the patient: labs are OK, except for low testost and high Vit B12 and D See meds changed   Please, keep  next office visit appointment.   Thank you !

## 2011-04-18 NOTE — Telephone Encounter (Signed)
Left mess for patient to call back.  

## 2011-04-19 ENCOUNTER — Other Ambulatory Visit: Payer: Self-pay | Admitting: *Deleted

## 2011-04-19 MED ORDER — VITAMIN D 1000 UNITS PO TABS: 1000.0000 [IU] | ORAL_TABLET | ORAL | Status: DC

## 2011-04-19 MED ORDER — VITAMIN B-12 1000 MCG SL SUBL: 1.0000 | SUBLINGUAL_TABLET | SUBLINGUAL | Status: DC

## 2011-04-19 MED ORDER — TESTOSTERONE MICRONIZED CRYS: CRYSTALS | Status: DC

## 2011-04-19 NOTE — Telephone Encounter (Signed)
Patient informed-Vit B12 & D changed to [1] QOD-sent to [facility] escript. Testosterone faxed to [facility] [compound]-faxed to [place]-LMOM about this Rx to different pharmacy.

## 2011-05-13 ENCOUNTER — Telehealth: Payer: Self-pay | Admitting: *Deleted

## 2011-05-13 NOTE — Telephone Encounter (Signed)
Pt states he needs letter for his employer stating that our office called and told him that he had an appt on 05/11/11 at 3:30 because he states he called the office on this day to advise [place] that he was running late and who ever answered the phone advised him that he was even on the schedule. So, he returned to work.

## 2011-05-14 NOTE — Telephone Encounter (Signed)
Pls write him a letter Thx

## 2011-05-16 NOTE — Telephone Encounter (Signed)
Letter ready for p/u. Pt informed  He requests this letter be faxed to: [phone]. Letter faxed/original upfront for pt to p/u. Elijah Birk

## 2011-07-15 ENCOUNTER — Ambulatory Visit: Payer: BC Managed Care – PPO | Admitting: Internal Medicine

## 2011-07-25 ENCOUNTER — Telehealth: Payer: Self-pay | Admitting: Pulmonary Disease

## 2011-07-25 NOTE — Telephone Encounter (Signed)
Download 01/11/11-07/10/11 shows residual AHI 7/h on 12 cm Leak ++

## 2011-10-25 ENCOUNTER — Telehealth: Payer: Self-pay | Admitting: *Deleted

## 2011-10-25 MED ORDER — TADALAFIL 20 MG PO TABS
20.0000 mg | ORAL_TABLET | Freq: Every day | ORAL | Status: DC | PRN
Start: 2011-10-25 — End: 2017-04-28

## 2011-10-25 NOTE — Telephone Encounter (Signed)
Ok done Thx 

## 2011-10-25 NOTE — Telephone Encounter (Signed)
Rf req for Cialis 20 mg take as directed. Ok to Rf? Med is not in pt chart.

## 2012-09-10 ENCOUNTER — Ambulatory Visit (INDEPENDENT_AMBULATORY_CARE_PROVIDER_SITE_OTHER): Payer: BC Managed Care – PPO | Admitting: Internal Medicine

## 2012-09-10 ENCOUNTER — Other Ambulatory Visit (INDEPENDENT_AMBULATORY_CARE_PROVIDER_SITE_OTHER): Payer: BC Managed Care – PPO

## 2012-09-10 ENCOUNTER — Encounter: Payer: Self-pay | Admitting: Internal Medicine

## 2012-09-10 VITALS — BP 140/98 | HR 84 | Temp 98.0°F | Resp 16 | Ht 76.0 in | Wt 290.0 lb

## 2012-09-10 DIAGNOSIS — J309 Allergic rhinitis, unspecified: Secondary | ICD-10-CM

## 2012-09-10 DIAGNOSIS — Z0000 Encounter for general adult medical examination without abnormal findings: Secondary | ICD-10-CM

## 2012-09-10 DIAGNOSIS — E739 Lactose intolerance, unspecified: Secondary | ICD-10-CM

## 2012-09-10 DIAGNOSIS — E559 Vitamin D deficiency, unspecified: Secondary | ICD-10-CM

## 2012-09-10 DIAGNOSIS — E538 Deficiency of other specified B group vitamins: Secondary | ICD-10-CM

## 2012-09-10 DIAGNOSIS — Z9989 Dependence on other enabling machines and devices: Secondary | ICD-10-CM

## 2012-09-10 DIAGNOSIS — G4733 Obstructive sleep apnea (adult) (pediatric): Secondary | ICD-10-CM

## 2012-09-10 DIAGNOSIS — E291 Testicular hypofunction: Secondary | ICD-10-CM

## 2012-09-10 DIAGNOSIS — Z23 Encounter for immunization: Secondary | ICD-10-CM

## 2012-09-10 LAB — BASIC METABOLIC PANEL
CO2: 29 mEq/L (ref 19–32)
Calcium: 9.3 mg/dL (ref 8.4–10.5)
Creatinine, Ser: 1.1 mg/dL (ref 0.4–1.5)
Glucose, Bld: 114 mg/dL — ABNORMAL HIGH (ref 70–99)

## 2012-09-10 LAB — CBC WITH DIFFERENTIAL/PLATELET
Basophils Absolute: 0 10*3/uL (ref 0.0–0.1)
Eosinophils Absolute: 0.2 10*3/uL (ref 0.0–0.7)
Hemoglobin: 14.7 g/dL (ref 13.0–17.0)
Lymphocytes Relative: 36.6 % (ref 12.0–46.0)
MCHC: 34.1 g/dL (ref 30.0–36.0)
Monocytes Absolute: 0.3 10*3/uL (ref 0.1–1.0)
Neutro Abs: 2.9 10*3/uL (ref 1.4–7.7)
RDW: 13.2 % (ref 11.5–14.6)

## 2012-09-10 LAB — HEPATIC FUNCTION PANEL
Albumin: 4.5 g/dL (ref 3.5–5.2)
Total Protein: 7.9 g/dL (ref 6.0–8.3)

## 2012-09-10 LAB — URINALYSIS
Nitrite: NEGATIVE
Specific Gravity, Urine: >=1.03 (ref 1.000–1.030)
Total Protein, Urine: NEGATIVE
pH: 6 (ref 5.0–8.0)

## 2012-09-10 LAB — LIPID PANEL
HDL: 50.4 mg/dL (ref >39.00)
Triglycerides: 48 mg/dL (ref 0.0–149.0)

## 2012-09-10 LAB — TSH: TSH: 1.19 u[IU]/mL (ref 0.35–5.50)

## 2012-09-10 MED ORDER — VITAMIN D 1000 UNITS PO TABS: 1000.0000 [IU] | ORAL_TABLET | ORAL | Status: DC

## 2012-09-10 MED ORDER — FLUTICASONE PROPIONATE 50 MCG/ACT NA SUSP: 2.0000 | Freq: Every day | NASAL | Status: DC

## 2012-09-10 NOTE — Assessment & Plan Note (Signed)
Discussed.

## 2012-09-10 NOTE — Assessment & Plan Note (Addendum)
We discussed age appropriate health related issues, including available/recomended screening tests and vaccinations. We discussed a need for adhering to healthy diet and exercise. Labs/EKG were reviewed/ordered. All questions were answered. tDAP Form filled out

## 2012-09-10 NOTE — Assessment & Plan Note (Signed)
Labs

## 2012-09-10 NOTE — Assessment & Plan Note (Signed)
Re-start B12 

## 2012-09-10 NOTE — Assessment & Plan Note (Signed)
Continue with current prescription therapy as reflected on the Med list. Lortidine prn

## 2012-09-10 NOTE — Assessment & Plan Note (Signed)
Continue with current prescription therapy as reflected on the Med list.  

## 2012-09-10 NOTE — Assessment & Plan Note (Signed)
Cont CPAP

## 2012-09-10 NOTE — Progress Notes (Signed)
   Subjective:    HPI  The patient is here for a wellness exam. The patient has been doing well overall without major physical or psychological issues going on lately.  The patient presents for a follow-up of  chronic hypertension, chronic dyslipidemia, type 2 diabetes controlled with medicines. C/o ED. F/u hypothyroidism.  BP Readings from Last 3 Encounters:  09/10/12 140/98  04/15/11 144/82  01/17/11 148/90   Wt Readings from Last 3 Encounters:  09/10/12 290 lb (131.543 kg)  04/15/11 278 lb (126.1 kg)  01/17/11 282 lb (127.914 kg)       Review of Systems  Constitutional: Negative for appetite change, fatigue and unexpected weight change.  HENT: Negative for nosebleeds, congestion, sore throat, sneezing, trouble swallowing and neck pain.   Eyes: Negative for itching and visual disturbance.  Respiratory: Negative for cough.   Cardiovascular: Negative for chest pain, palpitations and leg swelling.  Gastrointestinal: Negative for nausea, diarrhea, blood in stool and abdominal distention.  Genitourinary: Negative for frequency and hematuria.  Musculoskeletal: Negative for back pain, joint swelling and gait problem.  Skin: Negative for rash.  Neurological: Negative for dizziness, tremors, speech difficulty and weakness.  Psychiatric/Behavioral: Negative for sleep disturbance, dysphoric mood and agitation. The patient is not nervous/anxious.        Objective:   Physical Exam  Constitutional: He is oriented to person, place, and time. He appears well-developed.  HENT:  Mouth/Throat: Oropharynx is clear and moist.  Eyes: Conjunctivae are normal. Pupils are equal, round, and reactive to light.  Neck: Normal range of motion. No JVD present. No thyromegaly present.  Cardiovascular: Normal rate, regular rhythm, normal heart sounds and intact distal pulses.  Exam reveals no gallop and no friction rub.   No murmur heard. Pulmonary/Chest: Effort normal and breath sounds normal. No  respiratory distress. He has no wheezes. He has no rales. He exhibits no tenderness (a patch of pustular rash on R shoulder).  Abdominal: Soft. Bowel sounds are normal. He exhibits no distension and no mass. There is no tenderness. There is no rebound and no guarding.  Musculoskeletal: Normal range of motion. He exhibits no edema and no tenderness.  Lymphadenopathy:    He has no cervical adenopathy.  Neurological: He is alert and oriented to person, place, and time. He has normal reflexes. No cranial nerve deficit. He exhibits normal muscle tone. Coordination normal.  Skin: Skin is warm and dry. Rash noted.  Psychiatric: He has a normal mood and affect. His behavior is normal. Judgment and thought content normal.    Lab Results  Component Value Date   WBC 5.3 09/10/2012   HGB 14.7 09/10/2012   HCT 43.1 09/10/2012   PLT 258.0 09/10/2012   GLUCOSE 114* 09/10/2012   CHOL 146 09/10/2012   TRIG 48.0 09/10/2012   HDL 50.40 09/10/2012   LDLCALC 86 09/10/2012   ALT 33 09/10/2012   AST 24 09/10/2012   NA 138 09/10/2012   K 3.7 09/10/2012   CL 101 09/10/2012   CREATININE 1.1 09/10/2012   BUN 11 09/10/2012   CO2 29 09/10/2012   TSH 1.19 09/10/2012   PSA 0.66 04/15/2011   INR 1.13 04/06/2009   HGBA1C 6.0 09/10/2012         Assessment & Plan:

## 2012-09-12 ENCOUNTER — Telehealth: Payer: Self-pay | Admitting: Internal Medicine

## 2012-09-12 MED ORDER — ERGOCALCIFEROL 1.25 MG (50000 UT) PO CAPS: 50000.0000 [IU] | ORAL_CAPSULE | ORAL | Status: DC

## 2012-09-12 NOTE — Telephone Encounter (Signed)
Patient informed and voiced understanding

## 2012-09-12 NOTE — Telephone Encounter (Signed)
Robert Bridges, please, inform patient that all labs are normal except for low vit d and low testosterone and elev glu. Loose wt, exercise Take vit D Rx and OTC Thx

## 2013-11-05 ENCOUNTER — Telehealth: Payer: Self-pay | Admitting: Internal Medicine

## 2013-11-05 NOTE — Telephone Encounter (Signed)
Rec'd from [facility] forward 5 pages to Dr.Plotnikov  °

## 2014-02-21 ENCOUNTER — Encounter: Payer: Self-pay | Admitting: Internal Medicine

## 2014-02-21 ENCOUNTER — Other Ambulatory Visit (INDEPENDENT_AMBULATORY_CARE_PROVIDER_SITE_OTHER): Payer: BC Managed Care – PPO

## 2014-02-21 ENCOUNTER — Ambulatory Visit (INDEPENDENT_AMBULATORY_CARE_PROVIDER_SITE_OTHER): Payer: BC Managed Care – PPO | Admitting: Internal Medicine

## 2014-02-21 VITALS — BP 144/98 | HR 78 | Temp 98.2°F | Ht 76.0 in | Wt 277.2 lb

## 2014-02-21 DIAGNOSIS — L259 Unspecified contact dermatitis, unspecified cause: Secondary | ICD-10-CM

## 2014-02-21 DIAGNOSIS — L309 Dermatitis, unspecified: Secondary | ICD-10-CM

## 2014-02-21 DIAGNOSIS — R7309 Other abnormal glucose: Secondary | ICD-10-CM

## 2014-02-21 DIAGNOSIS — E538 Deficiency of other specified B group vitamins: Secondary | ICD-10-CM

## 2014-02-21 DIAGNOSIS — Z0000 Encounter for general adult medical examination without abnormal findings: Secondary | ICD-10-CM

## 2014-02-21 DIAGNOSIS — E291 Testicular hypofunction: Secondary | ICD-10-CM

## 2014-02-21 DIAGNOSIS — E559 Vitamin D deficiency, unspecified: Secondary | ICD-10-CM

## 2014-02-21 DIAGNOSIS — Z23 Encounter for immunization: Secondary | ICD-10-CM

## 2014-02-21 DIAGNOSIS — J309 Allergic rhinitis, unspecified: Secondary | ICD-10-CM

## 2014-02-21 LAB — CBC WITH DIFFERENTIAL/PLATELET
Basophils Absolute: 0 10*3/uL (ref 0.0–0.1)
Basophils Relative: 0.4 % (ref 0.0–3.0)
EOS ABS: 0.3 10*3/uL (ref 0.0–0.7)
Eosinophils Relative: 4.9 % (ref 0.0–5.0)
HEMATOCRIT: 43.6 % (ref 39.0–52.0)
Hemoglobin: 14.7 g/dL (ref 13.0–17.0)
LYMPHS ABS: 1.8 10*3/uL (ref 0.7–4.0)
Lymphocytes Relative: 33.9 % (ref 12.0–46.0)
MCHC: 33.7 g/dL (ref 30.0–36.0)
MCV: 86 fl (ref 78.0–100.0)
MONO ABS: 0.3 10*3/uL (ref 0.1–1.0)
Monocytes Relative: 6.3 % (ref 3.0–12.0)
NEUTROS ABS: 2.9 10*3/uL (ref 1.4–7.7)
Neutrophils Relative %: 54.5 % (ref 43.0–77.0)
Platelets: 269 10*3/uL (ref 150.0–400.0)
RBC: 5.07 Mil/uL (ref 4.22–5.81)
RDW: 14.1 % (ref 11.5–15.5)
WBC: 5.3 10*3/uL (ref 4.0–10.5)

## 2014-02-21 LAB — VITAMIN B12: Vitamin B-12: 281 pg/mL (ref 211–911)

## 2014-02-21 LAB — HEMOGLOBIN A1C: Hgb A1c MFr Bld: 6 % (ref 4.6–6.5)

## 2014-02-21 LAB — BASIC METABOLIC PANEL
BUN: 16 mg/dL (ref 6–23)
CO2: 27 mEq/L (ref 19–32)
CREATININE: 1.4 mg/dL (ref 0.4–1.5)
Calcium: 9.6 mg/dL (ref 8.4–10.5)
Chloride: 105 mEq/L (ref 96–112)
GFR: 71.34 mL/min (ref >60.00)
Glucose, Bld: 110 mg/dL — ABNORMAL HIGH (ref 70–99)
Potassium: 3.9 mEq/L (ref 3.5–5.1)
Sodium: 139 mEq/L (ref 135–145)

## 2014-02-21 LAB — TSH: TSH: 0.92 u[IU]/mL (ref 0.35–4.50)

## 2014-02-21 LAB — HEPATIC FUNCTION PANEL
ALBUMIN: 4.3 g/dL (ref 3.5–5.2)
ALT: 29 U/L (ref 0–53)
AST: 25 U/L (ref 0–37)
Alkaline Phosphatase: 57 U/L (ref 39–117)
Bilirubin, Direct: 0.2 mg/dL (ref 0.0–0.3)
Total Bilirubin: 1 mg/dL (ref 0.2–1.2)
Total Protein: 7.8 g/dL (ref 6.0–8.3)

## 2014-02-21 LAB — LIPID PANEL
Cholesterol: 146 mg/dL (ref 0–200)
HDL: 50.7 mg/dL (ref >39.00)
LDL Cholesterol: 85 mg/dL (ref 0–99)
NonHDL: 95.3
Total CHOL/HDL Ratio: 3
Triglycerides: 51 mg/dL (ref 0.0–149.0)
VLDL: 10.2 mg/dL (ref 0.0–40.0)

## 2014-02-21 LAB — VITAMIN D 25 HYDROXY (VIT D DEFICIENCY, FRACTURES): VITD: 31.69 ng/mL (ref 30.00–100.00)

## 2014-02-21 LAB — TESTOSTERONE: Testosterone: 327.82 ng/dL (ref 300.00–890.00)

## 2014-02-21 MED ORDER — TRIAMCINOLONE ACETONIDE 0.5 % EX CREA: 1.0000 "application " | TOPICAL_CREAM | Freq: Three times a day (TID) | CUTANEOUS | Status: DC

## 2014-02-21 MED ORDER — MONTELUKAST SODIUM 10 MG PO TABS: 10.0000 mg | ORAL_TABLET | Freq: Every day | ORAL | Status: DC

## 2014-02-21 NOTE — Assessment & Plan Note (Signed)
Labs

## 2014-02-21 NOTE — Assessment & Plan Note (Signed)
Chronic Risks associated with treatment noncompliance were discussed. Compliance was encouraged. Re-start Vit D Labs

## 2014-02-21 NOTE — Progress Notes (Signed)
Pre visit review using our clinic review tool, if applicable. No additional management support is needed unless otherwise documented below in the visit note. 

## 2014-02-21 NOTE — Assessment & Plan Note (Signed)
Better on Singulair

## 2014-02-21 NOTE — Assessment & Plan Note (Signed)
Continue with current B12 therapy as reflected on the Med list. Labs

## 2014-02-21 NOTE — Assessment & Plan Note (Signed)
Triamcinolone prn 

## 2014-02-21 NOTE — Progress Notes (Signed)
   Subjective:    HPI  The patient is here for a wellness exam. The patient has been doing well overall without major physical or psychological issues going on lately.  The patient presents for a follow-up of  chronic hypertension, chronic dyslipidemia, type 2 diabetes controlled with medicines. C/o allergies, ED. F/u hypothyroidism. C/o dry skin rash.  BP Readings from Last 3 Encounters:  02/21/14 144/98  09/10/12 140/98  04/15/11 144/82   Wt Readings from Last 3 Encounters:  02/21/14 277 lb 4 oz (125.76 kg)  09/10/12 290 lb (131.543 kg)  04/15/11 278 lb (126.1 kg)       Review of Systems  Constitutional: Negative for appetite change, fatigue and unexpected weight change.  HENT: Negative for congestion, nosebleeds, sneezing, sore throat and trouble swallowing.   Eyes: Negative for itching and visual disturbance.  Respiratory: Negative for cough.   Cardiovascular: Negative for chest pain, palpitations and leg swelling.  Gastrointestinal: Negative for nausea, diarrhea, blood in stool and abdominal distention.  Genitourinary: Negative for frequency and hematuria.  Musculoskeletal: Negative for back pain, gait problem, joint swelling and neck pain.  Skin: Negative for rash.  Neurological: Negative for dizziness, tremors, speech difficulty and weakness.  Psychiatric/Behavioral: Negative for sleep disturbance, dysphoric mood and agitation. The patient is not nervous/anxious.        Objective:   Physical Exam  Constitutional: He is oriented to person, place, and time. He appears well-developed.  HENT:  Mouth/Throat: Oropharynx is clear and moist.  Eyes: Conjunctivae are normal. Pupils are equal, round, and reactive to light.  Neck: Normal range of motion. No JVD present. No thyromegaly present.  Cardiovascular: Normal rate, regular rhythm, normal heart sounds and intact distal pulses.  Exam reveals no gallop and no friction rub.   No murmur heard. Pulmonary/Chest: Effort  normal and breath sounds normal. No respiratory distress. He has no wheezes. He has no rales. He exhibits no tenderness (a patch of pustular rash on R shoulder).  Abdominal: Soft. Bowel sounds are normal. He exhibits no distension and no mass. There is no tenderness. There is no rebound and no guarding.  Musculoskeletal: Normal range of motion. He exhibits no edema and no tenderness.  Lymphadenopathy:    He has no cervical adenopathy.  Neurological: He is alert and oriented to person, place, and time. He has normal reflexes. No cranial nerve deficit. He exhibits normal muscle tone. Coordination normal.  Skin: Skin is warm and dry. Rash noted.  Psychiatric: He has a normal mood and affect. His behavior is normal. Judgment and thought content normal.  scaly macules on B forearms Swollen nasal mucosa  Lab Results  Component Value Date   WBC 5.3 09/10/2012   HGB 14.7 09/10/2012   HCT 43.1 09/10/2012   PLT 258.0 09/10/2012   GLUCOSE 114* 09/10/2012   CHOL 146 09/10/2012   TRIG 48.0 09/10/2012   HDL 50.40 09/10/2012   LDLCALC 86 09/10/2012   ALT 33 09/10/2012   AST 24 09/10/2012   NA 138 09/10/2012   K 3.7 09/10/2012   CL 101 09/10/2012   CREATININE 1.1 09/10/2012   BUN 11 09/10/2012   CO2 29 09/10/2012   TSH 1.19 09/10/2012   PSA 0.66 04/15/2011   INR 1.13 04/06/2009   HGBA1C 6.0 09/10/2012         Assessment & Plan:

## 2014-02-21 NOTE — Assessment & Plan Note (Signed)
We discussed age appropriate health related issues, including available/recomended screening tests and vaccinations. We discussed a need for adhering to healthy diet and exercise. Labs/EKG were reviewed/ordered. All questions were answered. Flu shot

## 2014-07-05 ENCOUNTER — Other Ambulatory Visit: Payer: Self-pay | Admitting: Internal Medicine

## 2014-07-18 ENCOUNTER — Ambulatory Visit (INDEPENDENT_AMBULATORY_CARE_PROVIDER_SITE_OTHER): Payer: BC Managed Care – PPO | Admitting: Internal Medicine

## 2014-07-18 ENCOUNTER — Encounter: Payer: Self-pay | Admitting: Internal Medicine

## 2014-07-18 ENCOUNTER — Other Ambulatory Visit (INDEPENDENT_AMBULATORY_CARE_PROVIDER_SITE_OTHER): Payer: BC Managed Care – PPO

## 2014-07-18 VITALS — BP 160/120 | HR 81 | Temp 98.3°F | Wt 282.0 lb

## 2014-07-18 DIAGNOSIS — G4733 Obstructive sleep apnea (adult) (pediatric): Secondary | ICD-10-CM

## 2014-07-18 DIAGNOSIS — E559 Vitamin D deficiency, unspecified: Secondary | ICD-10-CM

## 2014-07-18 DIAGNOSIS — I10 Essential (primary) hypertension: Secondary | ICD-10-CM | POA: Insufficient documentation

## 2014-07-18 DIAGNOSIS — E291 Testicular hypofunction: Secondary | ICD-10-CM

## 2014-07-18 DIAGNOSIS — Z9989 Dependence on other enabling machines and devices: Secondary | ICD-10-CM

## 2014-07-18 DIAGNOSIS — E538 Deficiency of other specified B group vitamins: Secondary | ICD-10-CM

## 2014-07-18 LAB — CBC WITH DIFFERENTIAL/PLATELET
BASOS PCT: 0.8 % (ref 0.0–3.0)
Basophils Absolute: 0 10*3/uL (ref 0.0–0.1)
Eosinophils Absolute: 0.2 10*3/uL (ref 0.0–0.7)
Eosinophils Relative: 3.3 % (ref 0.0–5.0)
HEMATOCRIT: 41.4 % (ref 39.0–52.0)
Hemoglobin: 14.1 g/dL (ref 13.0–17.0)
LYMPHS ABS: 1.8 10*3/uL (ref 0.7–4.0)
Lymphocytes Relative: 31.8 % (ref 12.0–46.0)
MCHC: 34.1 g/dL (ref 30.0–36.0)
MCV: 84.3 fl (ref 78.0–100.0)
Monocytes Absolute: 0.3 10*3/uL (ref 0.1–1.0)
Monocytes Relative: 6.1 % (ref 3.0–12.0)
NEUTROS ABS: 3.3 10*3/uL (ref 1.4–7.7)
NEUTROS PCT: 58 % (ref 43.0–77.0)
Platelets: 278 10*3/uL (ref 150.0–400.0)
RBC: 4.91 Mil/uL (ref 4.22–5.81)
RDW: 13.6 % (ref 11.5–15.5)
WBC: 5.8 10*3/uL (ref 4.0–10.5)

## 2014-07-18 LAB — BASIC METABOLIC PANEL
BUN: 17 mg/dL (ref 6–23)
CALCIUM: 9.8 mg/dL (ref 8.4–10.5)
CO2: 28 meq/L (ref 19–32)
CREATININE: 1.48 mg/dL (ref 0.40–1.50)
Chloride: 105 mEq/L (ref 96–112)
GFR: 67.87 mL/min (ref >60.00)
Glucose, Bld: 112 mg/dL — ABNORMAL HIGH (ref 70–99)
POTASSIUM: 4.1 meq/L (ref 3.5–5.1)
Sodium: 140 mEq/L (ref 135–145)

## 2014-07-18 LAB — HEPATIC FUNCTION PANEL
ALK PHOS: 60 U/L (ref 39–117)
ALT: 33 U/L (ref 0–53)
AST: 21 U/L (ref 0–37)
Albumin: 4.5 g/dL (ref 3.5–5.2)
Bilirubin, Direct: 0.1 mg/dL (ref 0.0–0.3)
Total Bilirubin: 0.6 mg/dL (ref 0.2–1.2)
Total Protein: 7.5 g/dL (ref 6.0–8.3)

## 2014-07-18 LAB — VITAMIN D 25 HYDROXY (VIT D DEFICIENCY, FRACTURES): VITD: 85.84 ng/mL (ref 30.00–100.00)

## 2014-07-18 LAB — HEMOGLOBIN A1C: Hgb A1c MFr Bld: 6 % (ref 4.6–6.5)

## 2014-07-18 LAB — VITAMIN B12: VITAMIN B 12: 618 pg/mL (ref 211–911)

## 2014-07-18 LAB — TESTOSTERONE: Testosterone: 292.76 ng/dL — ABNORMAL LOW (ref 300.00–890.00)

## 2014-07-18 LAB — PSA: PSA: 0.71 ng/mL (ref 0.10–4.00)

## 2014-07-18 LAB — TSH: TSH: 0.94 u[IU]/mL (ref 0.35–4.50)

## 2014-07-18 MED ORDER — LOSARTAN POTASSIUM 100 MG PO TABS: 100.0000 mg | ORAL_TABLET | Freq: Every day | ORAL | Status: DC

## 2014-07-18 NOTE — Assessment & Plan Note (Signed)
Continue with current prescription therapy as reflected on the Med list. Labs  

## 2014-07-18 NOTE — Assessment & Plan Note (Signed)
Start Losartan NAS diet Wt loss

## 2014-07-18 NOTE — Assessment & Plan Note (Signed)
Continue with current prescription therapy as reflected on the Med list.  

## 2014-07-18 NOTE — Patient Instructions (Signed)
Low carb diet   There are natural ways to boost your testosterone:  1. Lose Weight If you're overweight, shedding the excess pounds may increase your testosterone levels, according to multiple research. Overweight men are more likely to have low testosterone levels to begin with, so this is an important trick to increase your body's testosterone production when you need it most.   2. Strength Training    Strength training is also known to boost testosterone levels, provided you are doing so intensely enough. When strength training to boost testosterone, you'll want to increase the weight and lower your number of reps, and then focus on exercises that work a large number of muscles.  3. Optimize Your Vitamin D Levels Vitamin D, a steroid hormone, is essential for the healthy development of the nucleus of the sperm cell, and helps maintain semen quality and sperm count. Vitamin D also increases levels of testosterone, which may boost libido. In one study, overweight men who were given vitamin D supplements had a significant increase in testosterone levels after one year.  4. Reduce Stress When you're under a lot of stress, your body releases high levels of the stress hormone cortisol. This hormone actually blocks the effects of testosterone, presumably because, from a biological standpoint, testosterone-associated behaviors (mating, competing, aggression) may have lowered your chances of survival in an emergency (hence, the "fight or flight" response is dominant, courtesy of cortisol).  5. Limit or Eliminate Sugar from Your Diet Testosterone levels decrease after you eat sugar, which is likely because the sugar leads to a high insulin level, another factor leading to low testosterone.  6. Eat Healthy Fats By healthy, this means not only mon- and polyunsaturated fats, like that found in avocadoes and nuts, but also saturated, as these are essential for building testosterone. Research shows that a  diet with less than 40 percent of energy as fat (and that mainly from animal sources, i.e. saturated) lead to a decrease in testosterone levels.  It's important to understand that your body requires saturated fats from animal and vegetable sources (such as meat, dairy, certain oils, and tropical plants like coconut) for optimal functioning, and if you neglect this important food group in favor of sugar, grains and other starchy carbs, your health and weight are almost guaranteed to suffer. Examples of healthy fats you can eat more of to give your testosterone levels a boost include:  Olives and Olive oil  Coconuts and coconut oil Butter made from organic milk  Raw nuts, such as, almonds or pecans Eggs Avocados   Meats Palm oil Unheated organic nut oils   7. "Testosterone boosters" containing Vitamin D-3, Niacin, Vitamin B-6, Vitamin B-12, Magnesium, Zinc, Selenium, D-Aspartic Acid, Fenugreed Seed Extract, Oystershell, [place] Extract, [place] Ginseng may be helpful as well.

## 2014-07-18 NOTE — Progress Notes (Signed)
   Subjective:    HPI  The patient is here for a wellness exam. C/o fatigue. Pt is drinking apple juice, Coolaid - a lot The patient presents for a follow-up of  chronic hypertension, chronic dyslipidemia, type 2 diabetes controlled with medicines. C/o allergies, ED. F/u hypothyroidism.   BP Readings from Last 3 Encounters:  07/18/14 160/120  02/21/14 144/98  09/10/12 140/98   Wt Readings from Last 3 Encounters:  07/18/14 282 lb (127.914 kg)  02/21/14 277 lb 4 oz (125.76 kg)  09/10/12 290 lb (131.543 kg)       Review of Systems  Constitutional: Negative for appetite change, fatigue and unexpected weight change.  HENT: Negative for congestion, nosebleeds, sneezing, sore throat and trouble swallowing.   Eyes: Negative for itching and visual disturbance.  Respiratory: Negative for cough.   Cardiovascular: Negative for chest pain, palpitations and leg swelling.  Gastrointestinal: Negative for nausea, diarrhea, blood in stool and abdominal distention.  Genitourinary: Negative for frequency and hematuria.  Musculoskeletal: Negative for back pain, joint swelling, gait problem and neck pain.  Skin: Negative for rash.  Neurological: Negative for dizziness, tremors, speech difficulty and weakness.  Psychiatric/Behavioral: Negative for sleep disturbance, dysphoric mood and agitation. The patient is not nervous/anxious.        Objective:   Physical Exam  Constitutional: He is oriented to person, place, and time. He appears well-developed.  HENT:  Mouth/Throat: Oropharynx is clear and moist.  Eyes: Conjunctivae are normal. Pupils are equal, round, and reactive to light.  Neck: Normal range of motion. No JVD present. No thyromegaly present.  Cardiovascular: Normal rate, regular rhythm, normal heart sounds and intact distal pulses.  Exam reveals no gallop and no friction rub.   No murmur heard. Pulmonary/Chest: Effort normal and breath sounds normal. No respiratory distress. He has no  wheezes. He has no rales. He exhibits no tenderness (a patch of pustular rash on R shoulder).  Abdominal: Soft. Bowel sounds are normal. He exhibits no distension and no mass. There is no tenderness. There is no rebound and no guarding.  Musculoskeletal: Normal range of motion. He exhibits no edema or tenderness.  Lymphadenopathy:    He has no cervical adenopathy.  Neurological: He is alert and oriented to person, place, and time. He has normal reflexes. No cranial nerve deficit. He exhibits normal muscle tone. Coordination normal.  Skin: Skin is warm and dry. Rash noted.  Psychiatric: He has a normal mood and affect. His behavior is normal. Judgment and thought content normal.  scaly macules on B forearms Swollen nasal mucosa  Lab Results  Component Value Date   WBC 5.3 02/21/2014   HGB 14.7 02/21/2014   HCT 43.6 02/21/2014   PLT 269.0 02/21/2014   GLUCOSE 110* 02/21/2014   CHOL 146 02/21/2014   TRIG 51.0 02/21/2014   HDL 50.70 02/21/2014   LDLCALC 85 02/21/2014   ALT 29 02/21/2014   AST 25 02/21/2014   NA 139 02/21/2014   K 3.9 02/21/2014   CL 105 02/21/2014   CREATININE 1.4 02/21/2014   BUN 16 02/21/2014   CO2 27 02/21/2014   TSH 0.92 02/21/2014   PSA 0.66 04/15/2011   INR 1.13 04/06/2009   HGBA1C 6.0 02/21/2014         Assessment & Plan:  Patient ID: Robert Bridges, male   DOB: Sep 19, 1975, 39 y.o.   MRN: [id number]

## 2014-07-22 ENCOUNTER — Telehealth: Payer: Self-pay | Admitting: Internal Medicine

## 2014-07-22 NOTE — Telephone Encounter (Signed)
emmi emailed °

## 2014-10-17 ENCOUNTER — Encounter: Payer: Self-pay | Admitting: Internal Medicine

## 2014-10-17 ENCOUNTER — Ambulatory Visit (INDEPENDENT_AMBULATORY_CARE_PROVIDER_SITE_OTHER): Payer: BC Managed Care – PPO | Admitting: Internal Medicine

## 2014-10-17 VITALS — BP 152/98 | HR 69 | Temp 97.9°F | Resp 16 | Ht 76.0 in | Wt 288.0 lb

## 2014-10-17 DIAGNOSIS — I10 Essential (primary) hypertension: Secondary | ICD-10-CM

## 2014-10-17 DIAGNOSIS — E538 Deficiency of other specified B group vitamins: Secondary | ICD-10-CM | POA: Diagnosis not present

## 2014-10-17 DIAGNOSIS — M129 Arthropathy, unspecified: Secondary | ICD-10-CM

## 2014-10-17 DIAGNOSIS — M1712 Unilateral primary osteoarthritis, left knee: Secondary | ICD-10-CM | POA: Diagnosis not present

## 2014-10-17 MED ORDER — AMLODIPINE BESYLATE 5 MG PO TABS: 5.0000 mg | ORAL_TABLET | Freq: Every day | ORAL | Status: DC

## 2014-10-17 MED ORDER — METHYLPREDNISOLONE ACETATE 80 MG/ML IJ SUSP
80.0000 mg | Freq: Once | INTRAMUSCULAR | Status: AC
  Administered 2014-10-17: 80 mg via INTRAMUSCULAR

## 2014-10-17 MED ORDER — LOSARTAN POTASSIUM 100 MG PO TABS: 100.0000 mg | ORAL_TABLET | Freq: Every day | ORAL | Status: DC

## 2014-10-17 MED ORDER — MELOXICAM 15 MG PO TABS: 15.0000 mg | ORAL_TABLET | Freq: Every day | ORAL | Status: DC | PRN

## 2014-10-17 MED ORDER — LEVOCETIRIZINE DIHYDROCHLORIDE 5 MG PO TABS: 5.0000 mg | ORAL_TABLET | Freq: Every evening | ORAL | Status: DC

## 2014-10-17 NOTE — Progress Notes (Signed)
Pre visit review using our clinic review tool, if applicable. No additional management support is needed unless otherwise documented below in the visit note. 

## 2014-10-17 NOTE — Progress Notes (Signed)
Subjective:    HPI   C/o recurrent swelling and pain in the L knee.   C/o fatigue. Pt is drinking apple juice, Coolaid - a lot The patient presents for a follow-up of  chronic hypertension, chronic dyslipidemia, type 2 diabetes controlled with medicines. C/o allergies, ED. F/u hypothyroidism.   BP Readings from Last 3 Encounters:  10/17/14 152/98  07/18/14 160/120  02/21/14 144/98   Wt Readings from Last 3 Encounters:  10/17/14 288 lb (130.636 kg)  07/18/14 282 lb (127.914 kg)  02/21/14 277 lb 4 oz (125.76 kg)       Review of Systems  Constitutional: Negative for appetite change, fatigue and unexpected weight change.  HENT: Negative for congestion, nosebleeds, sneezing, sore throat and trouble swallowing.   Eyes: Negative for itching and visual disturbance.  Respiratory: Negative for cough.   Cardiovascular: Negative for chest pain, palpitations and leg swelling.  Gastrointestinal: Negative for nausea, diarrhea, blood in stool and abdominal distention.  Genitourinary: Negative for frequency and hematuria.  Musculoskeletal: Negative for back pain, joint swelling, gait problem and neck pain.  Skin: Negative for rash.  Neurological: Negative for dizziness, tremors, speech difficulty and weakness.  Psychiatric/Behavioral: Negative for suicidal ideas, sleep disturbance, dysphoric mood and agitation. The patient is not nervous/anxious.        Objective:   Physical Exam  Constitutional: He is oriented to person, place, and time. He appears well-developed.  HENT:  Mouth/Throat: Oropharynx is clear and moist.  Eyes: Conjunctivae are normal. Pupils are equal, round, and reactive to light.  Neck: Normal range of motion. No JVD present. No thyromegaly present.  Cardiovascular: Normal rate, regular rhythm, normal heart sounds and intact distal pulses.  Exam reveals no gallop and no friction rub.   No murmur heard. Pulmonary/Chest: Effort normal and breath sounds normal. No  respiratory distress. He has no wheezes. He has no rales. He exhibits no tenderness (a patch of pustular rash on R shoulder).  Abdominal: Soft. Bowel sounds are normal. He exhibits no distension and no mass. There is no tenderness. There is no rebound and no guarding.  Musculoskeletal: Normal range of motion. He exhibits no edema or tenderness.  Lymphadenopathy:    He has no cervical adenopathy.  Neurological: He is alert and oriented to person, place, and time. He has normal reflexes. No cranial nerve deficit. He exhibits normal muscle tone. Coordination normal.  Skin: Skin is warm and dry. No rash noted.  Psychiatric: He has a normal mood and affect. His behavior is normal. Judgment and thought content normal.   Swollen nasal mucosa L knee is a little swollen, tender  Lab Results  Component Value Date   WBC 5.8 07/18/2014   HGB 14.1 07/18/2014   HCT 41.4 07/18/2014   PLT 278.0 07/18/2014   GLUCOSE 112* 07/18/2014   CHOL 146 02/21/2014   TRIG 51.0 02/21/2014   HDL 50.70 02/21/2014   LDLCALC 85 02/21/2014   ALT 33 07/18/2014   AST 21 07/18/2014   NA 140 07/18/2014   K 4.1 07/18/2014   CL 105 07/18/2014   CREATININE 1.48 07/18/2014   BUN 17 07/18/2014   CO2 28 07/18/2014   TSH 0.94 07/18/2014   PSA 0.71 07/18/2014   INR 1.13 04/06/2009   HGBA1C 6.0 07/18/2014     Procedure Note :     Procedure : Joint Injection,  L knee   Indication:  Joint osteoarthritis with refractory  chronic pain.   Risks including unsuccessful procedure , bleeding, infection,  bruising, skin atrophy, "steroid flare-up" and others were explained to the patient in detail as well as the benefits. Informed consent was obtained and signed.   Tthe patient was placed in a comfortable position. Lateral approach was used. Skin was prepped with Betadine and alcohol  and anesthetized a cooling spray. Then, a 5 cc syringe with a 1.5 inch long 25-gauge needle was used for a joint injection.. The needle was  advanced  Into the knee joint cavity. I aspirated a small amount of intra-articular fluid to confirm correct placement of the needle and injected the joint with 5 mL of 2% lidocaine and 80 mg of Depo-Medrol .  Band-Aid was applied.   Tolerated well. Complications: None. Good pain relief following the procedure.       Assessment & Plan:  Patient ID: Robert Bridges, male   DOB: [birth year], 39 y.o.   MRN: [id number]

## 2014-10-17 NOTE — Patient Instructions (Signed)
Postprocedure instructions :    A Band-Aid should be left on for 12 hours. Injection therapy is not a cure itself. It is used in conjunction with other modalities. You can use nonsteroidal anti-inflammatories like ibuprofen , hot and cold compresses. Rest is recommended in the next 24 hours. You need to report immediately  if fever, chills or any signs of infection develop. 

## 2014-10-17 NOTE — Assessment & Plan Note (Signed)
Recurrent sx Dr Sharol Given  -- h/o torn patella repair  See procedure

## 2014-10-17 NOTE — Assessment & Plan Note (Signed)
On [facility] only Labs

## 2014-10-17 NOTE — Assessment & Plan Note (Signed)
5/16 added Norvasc

## 2014-10-21 MED ORDER — METHYLPREDNISOLONE ACETATE 80 MG/ML IJ SUSP: 80.0000 mg | Freq: Once | INTRAMUSCULAR | Status: DC

## 2014-10-22 ENCOUNTER — Telehealth: Payer: Self-pay | Admitting: Internal Medicine

## 2014-10-22 NOTE — Telephone Encounter (Signed)
appt already set-up for tomorrow to see Dr. Linna Darner...Johny Chess

## 2014-10-22 NOTE — Telephone Encounter (Signed)
[facility] Patient Name: Robert Bridges DOB: 1976/01/14 Initial Comment Caller states his blood pressure meds work, knee swollen up. Can't even walk around. just hopping around. Nurse Assessment Nurse: Martyn Ehrich, RN, Felicia Date/Time (Eastern Time): 10/22/2014 3:05:15 PM Confirm and document reason for call. If symptomatic, describe symptoms. ---Left knee swelling onset 2 weeks ago and then he had hyrocortisone shot and he added another BP medication for him and antiinflamatory medication. Now all of a sudden the knee is a lot bigger since Friday visit. He is with the school nurse now and she took his BP now and it is 163/106 p-86 Has the patient traveled out of the country within the last 30 days? ---No Does the patient require triage? ---Yes Related visit to physician within the last 2 weeks? ---Sharyne Richters the PT have any chronic conditions? (i.e. diabetes, asthma, etc.) ---Yes List chronic conditions. ---started HTN med 3 months ago Guidelines Guideline Title Affirmed Question Affirmed Notes High Blood Pressure BP # 160/100 Knee Swelling [1] Very swollen joint AND [2] no fever Final Disposition User See Physician within [place], RN, [employer] Id: [id number]

## 2014-10-23 ENCOUNTER — Encounter: Payer: Self-pay | Admitting: Internal Medicine

## 2014-10-23 ENCOUNTER — Telehealth: Payer: Self-pay | Admitting: *Deleted

## 2014-10-23 ENCOUNTER — Ambulatory Visit (INDEPENDENT_AMBULATORY_CARE_PROVIDER_SITE_OTHER): Payer: BC Managed Care – PPO | Admitting: Internal Medicine

## 2014-10-23 VITALS — BP 148/98 | HR 86 | Temp 97.9°F | Resp 16 | Ht 76.0 in | Wt 290.5 lb

## 2014-10-23 DIAGNOSIS — G4710 Hypersomnia, unspecified: Secondary | ICD-10-CM | POA: Diagnosis not present

## 2014-10-23 DIAGNOSIS — M25462 Effusion, left knee: Secondary | ICD-10-CM

## 2014-10-23 MED ORDER — HYDROCHLOROTHIAZIDE 12.5 MG PO CAPS: 12.5000 mg | ORAL_CAPSULE | Freq: Every day | ORAL | Status: DC

## 2014-10-23 NOTE — Telephone Encounter (Signed)
[facility] - Client [facility] Patient Name: Robert Bridges Gender: Male DOB: Dec 25, 1975 Age: 39 Y 9 M 9 D Return Phone Number: [phone] (Primary) Address: City/State/Zip: [place] [address] Client [facility] - Client Client Site [facility] - Day Physician Plotnikov, Alex Contact Type Call Call Type Triage / Clinical Relationship To Patient Self Appointment Disposition EMR Appointment Scheduled Info pasted into Epic Yes Return Phone Number [phone] (Primary) Chief Complaint Joint Swelling Initial Comment Caller states his blood pressure meds work, knee swollen up. Can't even walk around. just hopping around. PreDisposition Go to Urgent Care/Walk-In Clinic Nurse Assessment Nurse: Martyn Ehrich, RN, Solmon Ice Date/Time Eilene Ghazi Time): 10/22/2014 3:05:15 PM Confirm and document reason for call. If symptomatic, describe symptoms. ---Left knee swelling onset 2 weeks ago and then he had hyrocortisone shot and he added another BP medication for him and antiinflamatory medication. Now all of a sudden the knee is a lot bigger since Friday visit. He is with the school nurse now and she took his BP now and it is 163/106 p-86 Has the patient traveled out of the country within the last 30 days? ---No Does the patient require triage? ---Yes Related visit to physician within the last 2 weeks? ---Yes Does the PT have any chronic conditions? (i.e. diabetes, asthma, etc.) ---Yes List chronic conditions. ---started HTN med 3 months ago Guidelines Guideline Title Affirmed Question Affirmed Notes Nurse Date/Time (Eastern Time) High Blood Pressure BP # 482/707 Gaddy, RN, Felicia 8/67/5449 2:01:00 PM Knee Swelling [1] Very swollen joint AND [2] no fever Martyn Ehrich, RN, Felicia 12/22/1973 8:83:25 PM Disp. Time Eilene Ghazi Time) Disposition Final User 10/22/2014 3:12:36 PM See PCP When Office is Open  (within 3 days) Martyn Ehrich, RN, [facility] 4/98/2641 5:83:09 PM Call Completed Martyn Ehrich, RN, Solmon Ice 09/18/6806 8:11:03 PM Call Completed Martyn Ehrich, RN, Solmon Ice PLEASE NOTE: All timestamps contained within this report are represented as [place] Standard Time. CONFIDENTIALTY NOTICE: This fax transmission is intended only for the addressee. It contains information that is legally privileged, confidential or otherwise protected from use or disclosure. If you are not the intended recipient, you are strictly prohibited from reviewing, disclosing, copying using or disseminating any of this information or taking any action in reliance on or regarding this information. If you have received this fax in error, please notify us immediately by telephone so that we can arrange for its return to [place]. Phone: [phone], Toll-Free: [phone], Fax: [phone] Page: 2 of 2 Call Id: [id number] 10/22/2014 3:17:03 PM See Physician within 24 Hours Yes Martyn Ehrich, RN, Alphia Kava Understands: Yes Disagree/Comply: Comply

## 2014-10-23 NOTE — Progress Notes (Signed)
   Subjective:    Patient ID: Robert Bridges, male    DOB: May 29, 1976, 39 y.o.   MRN: [id number]  HPI His left knee continues to be swollen. He was seen 10/17/14. No fluid could be aspirated; steroids were injected intra-articularly.  He states that he does have an effusion of the knee approximately once a year. He has had patellar repair as well as "the joint cleaned out" by an Orthopedist previously.  The fluid has not responded to ice and elevation.  The pain is a 3-4 at rest but can increase to 10 with ambulation.  There was no trigger or injury for this episode.  He denies history of gout.  Meloxicam 15 mg was of limited benefit.  He monitors his blood pressure at home and attends range 152-153/93-98. He has been compliant with his medicines. He does not restrict salt.  He has no active cardiopulmonary symptoms   Review of Systems He denies associated fever, chills, sweats. There's been no change in temperature or color over the affected joint.  He has no chest pain, palpitations, dyspnea, pedal edema, or claudication.    Objective:   Physical Exam Pertinent or positive findings include: There is marked effusion of the left knee with ballotability of the patella. There is decreased range of motion & discomfort with knee flexion.  General appearance :adequately nourished; in no distress. Eyes: No conjunctival inflammation or scleral icterus is present. Heart:  Normal rate and regular rhythm. S1 and S2 normal without gallop, murmur, click, rub or other extra sounds   Lungs:Chest clear to auscultation; no wheezes, rhonchi,rales ,or rubs present.No increased work of breathing.  Abdomen: bowel sounds normal, soft and non-tender without masses, organomegaly or hernias noted.  No guarding or rebound.  Vascular : all pulses equal ; no bruits present. Skin:Warm & dry.  Intact without suspicious lesions or rashes ; no tenting  Lymphatic: No lymphadenopathy is noted about the head,  neck, axilla Neuro: Strength, tone  normal.         Assessment & Plan:  #1 large left patellar effusion, ? etiology  #2 uncontrolled hypertension  Plan: Orthopedic referral  Meloxicam 15 mg twice a day until seen by the Orthopedist.  Add HCTZ 12.5 mg with ongoing blood pressure monitor.

## 2014-10-23 NOTE — Patient Instructions (Signed)
The Orthopedic  referral will be scheduled and you'll be notified of the time.Please call the Referral Co-Ordinator @ [phone] if you have not been notified of appointment time within 2 days.  Minimal Blood Pressure Goal= AVERAGE < 140/90;  Ideal is an AVERAGE < 135/85. This AVERAGE should be calculated from @ least 5-7 BP readings taken @ different times of day on different days of week. You should not respond to isolated BP readings , but rather the AVERAGE for that week .Please bring your  blood pressure cuff to office visits to verify that it is reliable.It  can also be checked against the blood pressure device at the pharmacy. Finger or wrist cuffs are not dependable; an arm cuff is.

## 2014-10-23 NOTE — Progress Notes (Signed)
Pre visit review using our clinic review tool, if applicable. No additional management support is needed unless otherwise documented below in the visit note. 

## 2015-01-19 ENCOUNTER — Ambulatory Visit: Payer: BC Managed Care – PPO | Admitting: Internal Medicine

## 2015-02-24 ENCOUNTER — Encounter: Payer: Self-pay | Admitting: Internal Medicine

## 2015-02-24 ENCOUNTER — Other Ambulatory Visit: Payer: Self-pay | Admitting: Internal Medicine

## 2015-03-04 NOTE — Telephone Encounter (Signed)
I called [facility] @ [phone]  and left a detailed mess about patient's request for new CPAP machine. I need to know what we need to put on Rx or if there is a form we can complete.

## 2015-03-06 ENCOUNTER — Telehealth: Payer: Self-pay | Admitting: *Deleted

## 2015-03-06 NOTE — Telephone Encounter (Signed)
Per pt's request on 03/05/15 I faxed a new CPAP and needed supplies same settings as before 12 cm H20 to [facility] at [phone].

## 2015-07-19 ENCOUNTER — Other Ambulatory Visit: Payer: Self-pay | Admitting: Internal Medicine

## 2015-08-15 ENCOUNTER — Other Ambulatory Visit: Payer: Self-pay | Admitting: Internal Medicine

## 2015-10-12 ENCOUNTER — Other Ambulatory Visit: Payer: Self-pay | Admitting: Internal Medicine

## 2015-11-10 ENCOUNTER — Other Ambulatory Visit: Payer: Self-pay | Admitting: Internal Medicine

## 2015-11-19 ENCOUNTER — Other Ambulatory Visit: Payer: Self-pay | Admitting: Internal Medicine

## 2015-11-30 ENCOUNTER — Other Ambulatory Visit: Payer: Self-pay | Admitting: Internal Medicine

## 2016-01-04 ENCOUNTER — Encounter: Payer: Self-pay | Admitting: Internal Medicine

## 2016-01-04 ENCOUNTER — Ambulatory Visit (INDEPENDENT_AMBULATORY_CARE_PROVIDER_SITE_OTHER): Payer: BC Managed Care – PPO | Admitting: Internal Medicine

## 2016-01-04 DIAGNOSIS — I10 Essential (primary) hypertension: Secondary | ICD-10-CM | POA: Diagnosis not present

## 2016-01-04 DIAGNOSIS — B351 Tinea unguium: Secondary | ICD-10-CM | POA: Insufficient documentation

## 2016-01-04 DIAGNOSIS — D179 Benign lipomatous neoplasm, unspecified: Secondary | ICD-10-CM | POA: Diagnosis not present

## 2016-01-04 MED ORDER — TELMISARTAN 80 MG PO TABS: 80.0000 mg | ORAL_TABLET | Freq: Every day | ORAL | 3 refills | Status: DC

## 2016-01-04 MED ORDER — CICLOPIROX 8 % EX SOLN: Freq: Every day | CUTANEOUS | 0 refills | Status: DC

## 2016-01-04 NOTE — Progress Notes (Signed)
Pre visit review using our clinic review tool, if applicable. No additional management support is needed unless otherwise documented below in the visit note. 

## 2016-01-04 NOTE — Progress Notes (Signed)
Subjective:  Patient ID: Robert Lab Sr., male    DOB: 1976-02-03  Age: 40 y.o. MRN: [id number]  CC: No chief complaint on file.   HPI Robert Lab Sr. presents for LBP C/o L breast lump that wife found 3-4 d ago - NT F/u HTN   Outpatient Medications Prior to Visit  Medication Sig Dispense Refill  . amLODipine (NORVASC) 5 MG tablet TAKE 1 TABLET (5 MG TOTAL) BY MOUTH DAILY. 90 tablet 0  . fluticasone (FLONASE) 50 MCG/ACT nasal spray Place 2 sprays into the nose daily. 16 g 6  . levocetirizine (XYZAL) 5 MG tablet TAKE 1 TABLET (5 MG TOTAL) BY MOUTH EVERY EVENING. 30 tablet 5  . losartan (COZAAR) 100 MG tablet TAKE 1 TABLET (100 MG TOTAL) BY MOUTH DAILY. 90 tablet 0  . meloxicam (MOBIC) 15 MG tablet Take 1 tablet (15 mg total) by mouth daily. Yearly physical is due must see MD for refills 30 tablet 0  . multivitamin (ONE-A-DAY MEN'S) TABS tablet Take 1 tablet by mouth daily.    . Naproxen Sod-Diphenhydramine 220-25 MG TABS Take 1 tablet by mouth daily.    . ergocalciferol (VITAMIN D2) 50000 UNITS capsule Take 1 capsule (50,000 Units total) by mouth once a week. 6 capsule 0  . aspirin-acetaminophen-caffeine (EXCEDRIN MIGRAINE) [id number] MG per tablet Take 2 tablets by mouth as needed.      . cholecalciferol (VITAMIN D) 1000 UNITS tablet Take 1 tablet (1,000 Units total) by mouth every other day. 200 tablet 3  . Ferrous Sulfate (IRON SUPPLEMENT PO) Take 1 each by mouth daily.    . hydrochlorothiazide (MICROZIDE) 12.5 MG capsule Take 1 capsule (12.5 mg total) by mouth daily. (Patient not taking: Reported on 01/04/2016) 30 capsule 2  . hydrocortisone-pramoxine (PROCTOFOAM-HC) rectal foam Place 1 applicator rectally 2 (two) times daily. As needed.     . lansoprazole (PREVACID) 30 MG capsule Take 30 mg by mouth as needed.      . tadalafil (CIALIS) 20 MG tablet Take 1 tablet (20 mg total) by mouth daily as needed for erectile dysfunction. 10 tablet 5   Facility-Administered Medications  Prior to Visit  Medication Dose Route Frequency Provider Last Rate Last Dose  . methylPREDNISolone acetate (DEPO-MEDROL) injection 80 mg  80 mg Intra-articular Once Cassandria Anger, MD        ROS Review of Systems  Objective:  BP (!) 148/98   Pulse 75   Temp 98 F (36.7 C) (Oral)   Wt 298 lb (135.2 kg)   SpO2 97%   BMI 36.27 kg/m   BP Readings from Last 3 Encounters:  01/04/16 (!) 148/98  10/23/14 (!) 148/98  10/17/14 (!) 152/98    Wt Readings from Last 3 Encounters:  01/04/16 298 lb (135.2 kg)  10/23/14 290 lb 8 oz (131.8 kg)  10/17/14 288 lb (130.6 kg)    Physical Exam Oval lipoma under L breast, delow the fold 2.5x1 cm Round lipoma on sacral spine 3x3 cm ?onycho B big toes Lab Results  Component Value Date   WBC 5.8 07/18/2014   HGB 14.1 07/18/2014   HCT 41.4 07/18/2014   PLT 278.0 07/18/2014   GLUCOSE 112 (H) 07/18/2014   CHOL 146 02/21/2014   TRIG 51.0 02/21/2014   HDL 50.70 02/21/2014   LDLCALC 85 02/21/2014   ALT 33 07/18/2014   AST 21 07/18/2014   NA 140 07/18/2014   K 4.1 07/18/2014   CL 105 07/18/2014   CREATININE 1.48 07/18/2014  BUN 17 07/18/2014   CO2 28 07/18/2014   TSH 0.94 07/18/2014   PSA 0.71 07/18/2014   INR 1.13 04/06/2009   HGBA1C 6.0 07/18/2014    No results found.  Assessment & Plan:   There are no diagnoses linked to this encounter. I have discontinued Mr. Chandra's ergocalciferol. I am also having him maintain his aspirin-acetaminophen-caffeine, lansoprazole, hydrocortisone-pramoxine, tadalafil, fluticasone, cholecalciferol, Ferrous Sulfate (IRON SUPPLEMENT PO), multivitamin, Naproxen Sod-Diphenhydramine, hydrochlorothiazide, levocetirizine, meloxicam, amLODipine, and losartan. We will continue to administer methylPREDNISolone acetate.  No orders of the defined types were placed in this encounter.    Follow-up: No Follow-up on file.  Walker Kehr, MD

## 2016-01-04 NOTE — Assessment & Plan Note (Signed)
Toenails Start [place]

## 2016-01-04 NOTE — Assessment & Plan Note (Signed)
7/17 L chest wall, sacral back Discussed

## 2016-01-04 NOTE — Patient Instructions (Signed)

## 2016-02-07 ENCOUNTER — Other Ambulatory Visit: Payer: Self-pay | Admitting: Internal Medicine

## 2016-02-25 ENCOUNTER — Other Ambulatory Visit: Payer: Self-pay | Admitting: Internal Medicine

## 2016-03-16 ENCOUNTER — Telehealth: Payer: Self-pay | Admitting: Internal Medicine

## 2016-03-16 ENCOUNTER — Ambulatory Visit (INDEPENDENT_AMBULATORY_CARE_PROVIDER_SITE_OTHER): Payer: BC Managed Care – PPO | Admitting: Internal Medicine

## 2016-03-16 ENCOUNTER — Encounter: Payer: Self-pay | Admitting: Internal Medicine

## 2016-03-16 VITALS — BP 160/84 | HR 95 | Temp 98.3°F | Resp 20 | Wt 297.0 lb

## 2016-03-16 DIAGNOSIS — I10 Essential (primary) hypertension: Secondary | ICD-10-CM | POA: Diagnosis not present

## 2016-03-16 DIAGNOSIS — E739 Lactose intolerance, unspecified: Secondary | ICD-10-CM | POA: Diagnosis not present

## 2016-03-16 MED ORDER — AMLODIPINE BESYLATE 10 MG PO TABS: 10.0000 mg | ORAL_TABLET | Freq: Every day | ORAL | 3 refills | Status: DC

## 2016-03-16 MED ORDER — TELMISARTAN-HCTZ 80-25 MG PO TABS: 1.0000 | ORAL_TABLET | Freq: Every day | ORAL | 3 refills | Status: DC
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
## 2016-03-16 NOTE — Patient Instructions (Signed)
OK to stop the micardis 80 mg pill, and the amlodipine 5 mg pill  Please take all new medication as prescribed - the Micardis HCT 80/25 mg - 1 per day, and the increased Amlodipine to 10 mg per day  Please continue all other medications as before, and refills have been done if requested.  Please have the pharmacy call with any other refills you may need.  Please keep your appointments with your specialists as you may have planned  Please return in 1 month to Dr Alain Marion, or sooner if needed

## 2016-03-16 NOTE — Telephone Encounter (Signed)
PLEASE NOTE: All timestamps contained within this report are represented as [place] Standard Time. CONFIDENTIALTY NOTICE: This fax transmission is intended only for the addressee. It contains information that is legally privileged, confidential or otherwise protected from use or disclosure. If you are not the intended recipient, you are strictly prohibited from reviewing, disclosing, copying using or disseminating any of this information or taking any action in reliance on or regarding this information. If you have received this fax in error, please notify us immediately by telephone so that we can arrange for its return to [place]. Phone: [phone], Toll-Free: [phone], Fax: [phone] Page: 1 of 1 Call Id: [id number] [facility] - Client [place] Patient Name: Robert Bridges DOB: [birth year] Initial Comment Caller states his lip is tingling, bp is 152/102. Nurse Assessment Nurse: Dimas Chyle, RN, Dellis Filbert Date/Time Eilene Ghazi Time): 03/16/2016 11:31:12 AM Confirm and document reason for call. If symptomatic, describe symptoms. You must click the next button to save text entered. ---Caller states his lip is tingling, B/P is 152/102. Symptoms started today. No headache. Losartan and potassium supplement. Has the patient traveled out of the country within the last 30 days? ---No Does the patient have any new or worsening symptoms? ---Yes Will a triage be completed? ---Yes Related visit to physician within the last 2 weeks? ---No Does the PT have any chronic conditions? (i.e. diabetes, asthma, etc.) ---Yes List chronic conditions. ---HTN Is this a behavioral health or substance abuse call? ---No Guidelines Guideline Title Affirmed Question Affirmed Notes Upgraded caller outcome due to symptoms Final Disposition User See Physician within 4 Hours (or PCP triage) Dimas Chyle, RN, Dellis Filbert Disagree/Comply: Comply

## 2016-03-16 NOTE — Progress Notes (Signed)
Pre visit review using our clinic review tool, if applicable. No additional management support is needed unless otherwise documented below in the visit note. 

## 2016-03-19 NOTE — Assessment & Plan Note (Signed)
Persistent mild uncontrolled, will seek to maximize current meds with micardis HCT 80/25, and increased amlodipine 10 qd, f/u 1 mo with Dr Plotnikov/PCP, consider add hydralazine for further uncontrolled

## 2016-03-19 NOTE — Assessment & Plan Note (Signed)
stable overall by history and exam, recent data reviewed with pt, and pt to continue medical treatment as before,  to f/u any worsening symptoms or concerns Lab Results  Component Value Date   HGBA1C 6.0 07/18/2014

## 2016-06-10 ENCOUNTER — Ambulatory Visit (INDEPENDENT_AMBULATORY_CARE_PROVIDER_SITE_OTHER): Payer: BC Managed Care – PPO | Admitting: Family Medicine

## 2016-06-10 ENCOUNTER — Encounter: Payer: Self-pay | Admitting: Family Medicine

## 2016-06-10 VITALS — BP 120/92 | HR 96 | Temp 98.6°F | Ht 76.0 in | Wt 294.9 lb

## 2016-06-10 DIAGNOSIS — K219 Gastro-esophageal reflux disease without esophagitis: Secondary | ICD-10-CM | POA: Diagnosis not present

## 2016-06-10 MED ORDER — OMEPRAZOLE 40 MG PO CPDR: 40.0000 mg | DELAYED_RELEASE_CAPSULE | Freq: Every day | ORAL | 0 refills | Status: DC
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
## 2016-06-10 NOTE — Progress Notes (Signed)
Pre visit review using our clinic review tool, if applicable. No additional management support is needed unless otherwise documented below in the visit note. 

## 2016-06-10 NOTE — Patient Instructions (Signed)
Schedule follow up with your primary doctor in 2-4 weeks.  Avoid alcohol use and use of NSAIDs medications (ibuprofen, naproxen, mobic, etc)  Follow the dietary recommendations.  Eat a healthy diet, small meals and get regular exercise.  Take prilosec 40mg  once daily for 2 weeks. Then can use 20mg  once daily if needed.  Seek care sooner if worsening or new concerns.

## 2016-09-12 ENCOUNTER — Telehealth: Payer: Self-pay | Admitting: Pulmonary Disease

## 2016-09-12 ENCOUNTER — Telehealth: Payer: Self-pay | Admitting: *Deleted

## 2016-09-12 NOTE — Telephone Encounter (Signed)
lmtcb X1 for patient. He will need to re-establish care with clinic- hasn't been seen since 2012.

## 2016-09-12 NOTE — Telephone Encounter (Signed)
Rec'd call pt states he need to get an order for his CPAP supplies. Inform pt he will need to contact his Pulmonologist " per chart Dr. Elsworth Soho" to get order from them...Robert Bridges

## 2016-09-13 NOTE — Telephone Encounter (Signed)
Spoke with the pt  He is requesting CPAP supplies  I advised would need appt to re est care since not seen since 2012 Offered appt with RA for tomorrow and he declined  He is not willing to come in at all  Will close encounter

## 2016-10-06 ENCOUNTER — Encounter: Payer: Self-pay | Admitting: Internal Medicine

## 2016-10-06 ENCOUNTER — Ambulatory Visit (INDEPENDENT_AMBULATORY_CARE_PROVIDER_SITE_OTHER): Payer: BC Managed Care – PPO | Admitting: Internal Medicine

## 2016-10-06 ENCOUNTER — Other Ambulatory Visit: Payer: Self-pay | Admitting: Internal Medicine

## 2016-10-06 VITALS — BP 128/82 | HR 80 | Temp 98.3°F | Ht 76.0 in | Wt 295.0 lb

## 2016-10-06 DIAGNOSIS — I10 Essential (primary) hypertension: Secondary | ICD-10-CM | POA: Diagnosis not present

## 2016-10-06 DIAGNOSIS — E538 Deficiency of other specified B group vitamins: Secondary | ICD-10-CM | POA: Diagnosis not present

## 2016-10-06 DIAGNOSIS — Z0000 Encounter for general adult medical examination without abnormal findings: Secondary | ICD-10-CM

## 2016-10-06 DIAGNOSIS — M1732 Unilateral post-traumatic osteoarthritis, left knee: Secondary | ICD-10-CM

## 2016-10-06 DIAGNOSIS — M1712 Unilateral primary osteoarthritis, left knee: Secondary | ICD-10-CM

## 2016-10-06 DIAGNOSIS — J301 Allergic rhinitis due to pollen: Secondary | ICD-10-CM

## 2016-10-06 DIAGNOSIS — Z00129 Encounter for routine child health examination without abnormal findings: Principal | ICD-10-CM

## 2016-10-06 MED ORDER — METHYLPREDNISOLONE ACETATE 40 MG/ML IJ SUSP
40.0000 mg | Freq: Once | INTRAMUSCULAR | Status: AC
  Administered 2016-10-06: 40 mg via INTRA_ARTICULAR

## 2016-10-06 MED ORDER — METHYLPREDNISOLONE 4 MG PO TBPK: ORAL_TABLET | ORAL | 0 refills | Status: DC

## 2016-10-06 MED ORDER — FLUTICASONE PROPIONATE 50 MCG/ACT NA SUSP: 2.0000 | Freq: Every day | NASAL | 6 refills | Status: DC

## 2016-10-06 MED ORDER — CETIRIZINE HCL 10 MG PO TABS: 10.0000 mg | ORAL_TABLET | Freq: Every day | ORAL | 11 refills | Status: DC

## 2016-10-06 NOTE — Assessment & Plan Note (Signed)
Worse Zyrtec Flonase 

## 2016-10-06 NOTE — Patient Instructions (Signed)
Postprocedure instructions :    A Band-Aid should be left on for 12 hours. Injection therapy is not a cure itself. It is used in conjunction with other modalities. You can use nonsteroidal anti-inflammatories like ibuprofen , hot and cold compresses. Rest is recommended in the next 24 hours. You need to report immediately  if fever, chills or any signs of infection develop. 

## 2016-10-06 NOTE — Progress Notes (Signed)
Subjective:  Patient ID: Robert Lab Sr., male    DOB: [birth year]  Age: 41 y.o. MRN: [id number]  CC: No chief complaint on file.   HPI Robert Lab Sr. presents for allergies - worse C/o L knee pain - worse   Outpatient Medications Prior to Visit  Medication Sig Dispense Refill  . amLODipine (NORVASC) 10 MG tablet Take 1 tablet (10 mg total) by mouth daily. 90 tablet 3  . aspirin-acetaminophen-caffeine (EXCEDRIN MIGRAINE) 546-568-12 MG per tablet Take 2 tablets by mouth as needed.      . ciclopirox (PENLAC) 8 % solution Apply topically at bedtime. Apply over nail and surrounding skin. Apply daily over previous coat. After seven (7) days, may remove with alcohol and continue cycle. 6.6 mL 0  . Ferrous Sulfate (IRON SUPPLEMENT PO) Take 1 each by mouth daily.    . fluticasone (FLONASE) 50 MCG/ACT nasal spray Place 2 sprays into the nose daily. 16 g 6  . hydrocortisone-pramoxine (PROCTOFOAM-HC) rectal foam Place 1 applicator rectally 2 (two) times daily. As needed.     . lansoprazole (PREVACID) 30 MG capsule Take 30 mg by mouth as needed.      Marland Kitchen levocetirizine (XYZAL) 5 MG tablet TAKE 1 TABLET (5 MG TOTAL) BY MOUTH EVERY EVENING. 30 tablet 5  . multivitamin (ONE-A-DAY MEN'S) TABS tablet Take 1 tablet by mouth daily.    Marland Kitchen omeprazole (PRILOSEC) 40 MG capsule Take 1 capsule (40 mg total) by mouth daily. 14 capsule 0  . telmisartan (MICARDIS) 80 MG tablet Take 1 tablet (80 mg total) by mouth daily. 90 tablet 3  . telmisartan-hydrochlorothiazide (MICARDIS HCT) 80-25 MG tablet Take 1 tablet by mouth daily. 90 tablet 3  . cholecalciferol (VITAMIN D) 1000 UNITS tablet Take 1 tablet (1,000 Units total) by mouth every other day. 200 tablet 3  . tadalafil (CIALIS) 20 MG tablet Take 1 tablet (20 mg total) by mouth daily as needed for erectile dysfunction. 10 tablet 5   No facility-administered medications prior to visit.     ROS Review of Systems  Constitutional: Negative for appetite  change, fatigue and unexpected weight change.  HENT: Negative for congestion, nosebleeds, sneezing, sore throat and trouble swallowing.   Eyes: Negative for itching and visual disturbance.  Respiratory: Negative for cough.   Cardiovascular: Negative for chest pain, palpitations and leg swelling.  Gastrointestinal: Negative for abdominal distention, blood in stool, diarrhea and nausea.  Genitourinary: Negative for frequency and hematuria.  Musculoskeletal: Negative for back pain, gait problem, joint swelling and neck pain.  Skin: Negative for rash.  Neurological: Negative for dizziness, tremors, speech difficulty and weakness.  Psychiatric/Behavioral: Negative for agitation, dysphoric mood and sleep disturbance. The patient is not nervous/anxious.     Objective:  BP 128/82 (BP Location: Left Arm, Patient Position: Sitting, Cuff Size: Large)   Pulse 80   Temp 98.3 F (36.8 C) (Oral)   Ht 6\' 4"  (1.93 m)   Wt 295 lb 0.6 oz (133.8 kg)   SpO2 98%   BMI 35.91 kg/m   BP Readings from Last 3 Encounters:  10/06/16 128/82  06/10/16 (!) 120/92  03/16/16 (!) 160/84    Wt Readings from Last 3 Encounters:  10/06/16 295 lb 0.6 oz (133.8 kg)  06/10/16 294 lb 14.4 oz (133.8 kg)  03/16/16 297 lb (134.7 kg)    Physical Exam  Constitutional: He is oriented to person, place, and time. He appears well-developed. No distress.  NAD  HENT:  Mouth/Throat: Oropharynx is clear and moist.  Eyes: Conjunctivae are normal. Pupils are equal, round, and reactive to light.  Neck: Normal range of motion. No JVD present. No thyromegaly present.  Cardiovascular: Normal rate, regular rhythm, normal heart sounds and intact distal pulses.  Exam reveals no gallop and no friction rub.   No murmur heard. Pulmonary/Chest: Effort normal and breath sounds normal. No respiratory distress. He has no wheezes. He has no rales. He exhibits no tenderness.  Abdominal: Soft. Bowel sounds are normal. He exhibits no distension  and no mass. There is no tenderness. There is no rebound and no guarding.  Musculoskeletal: Normal range of motion. He exhibits no edema or tenderness.  Lymphadenopathy:    He has no cervical adenopathy.  Neurological: He is alert and oriented to person, place, and time. He has normal reflexes. No cranial nerve deficit. He exhibits normal muscle tone. He displays a negative Romberg sign. Coordination and gait normal.  Skin: Skin is warm and dry. No rash noted.  Psychiatric: He has a normal mood and affect. His behavior is normal. Judgment and thought content normal.   L knee is tender   Procedure Note :     Procedure : Joint Injection, L  knee   Indication:  Joint osteoarthritis with refractory  chronic pain.   Risks including unsuccessful procedure , bleeding, infection, bruising, skin atrophy, "steroid flare-up" and others were explained to the patient in detail as well as the benefits. Informed consent was obtained and signed.   Tthe patient was placed in a comfortable position. Lateral approach was used. Skin was prepped with Betadine and alcohol  and anesthetized a cooling spray. Then, a 5 cc syringe with a 1.5 inch long 25-gauge needle was used for a joint injection.. The needle was advanced  Into the knee joint cavity. I aspirated a small amount of intra-articular fluid to confirm correct placement of the needle and injected the joint with 5 mL of 2% lidocaine and 40 mg of Depo-Medrol .  Band-Aid was applied.   Tolerated well. Complications: None. Good pain relief following the procedure.    Lab Results  Component Value Date   WBC 5.8 07/18/2014   HGB 14.1 07/18/2014   HCT 41.4 07/18/2014   PLT 278.0 07/18/2014   GLUCOSE 112 (H) 07/18/2014   CHOL 146 02/21/2014   TRIG 51.0 02/21/2014   HDL 50.70 02/21/2014   LDLCALC 85 02/21/2014   ALT 33 07/18/2014   AST 21 07/18/2014   NA 140 07/18/2014   K 4.1 07/18/2014   CL 105 07/18/2014   CREATININE 1.48 07/18/2014   BUN 17  07/18/2014   CO2 28 07/18/2014   TSH 0.94 07/18/2014   PSA 0.71 07/18/2014   INR 1.13 04/06/2009   HGBA1C 6.0 07/18/2014    No results found.  Assessment & Plan:   There are no diagnoses linked to this encounter. I am having Mr. Alabi maintain his aspirin-acetaminophen-caffeine, lansoprazole, hydrocortisone-pramoxine, tadalafil, fluticasone, cholecalciferol, Ferrous Sulfate (IRON SUPPLEMENT PO), multivitamin, levocetirizine, telmisartan, ciclopirox, telmisartan-hydrochlorothiazide, amLODipine, and omeprazole.  No orders of the defined types were placed in this encounter.    Follow-up: No Follow-up on file.  Walker Kehr, MD

## 2016-10-06 NOTE — Assessment & Plan Note (Signed)
BP Readings from Last 3 Encounters:  10/06/16 128/82  06/10/16 (!) 120/92  03/16/16 (!) 160/84

## 2016-10-06 NOTE — Progress Notes (Signed)
Pre visit review using our clinic review tool, if applicable. No additional management support is needed unless otherwise documented below in the visit note. 

## 2016-10-06 NOTE — Assessment & Plan Note (Signed)
Worse - will inject

## 2016-10-06 NOTE — Assessment & Plan Note (Signed)
On Vit pac

## 2017-01-06 ENCOUNTER — Encounter: Payer: BC Managed Care – PPO | Admitting: Internal Medicine

## 2017-01-13 ENCOUNTER — Telehealth: Payer: Self-pay | Admitting: Internal Medicine

## 2017-01-13 ENCOUNTER — Other Ambulatory Visit (INDEPENDENT_AMBULATORY_CARE_PROVIDER_SITE_OTHER): Payer: BC Managed Care – PPO

## 2017-01-13 DIAGNOSIS — E538 Deficiency of other specified B group vitamins: Secondary | ICD-10-CM

## 2017-01-13 DIAGNOSIS — Z125 Encounter for screening for malignant neoplasm of prostate: Secondary | ICD-10-CM

## 2017-01-13 DIAGNOSIS — Z0000 Encounter for general adult medical examination without abnormal findings: Secondary | ICD-10-CM | POA: Diagnosis not present

## 2017-01-13 DIAGNOSIS — Z00129 Encounter for routine child health examination without abnormal findings: Secondary | ICD-10-CM

## 2017-01-13 LAB — BASIC METABOLIC PANEL
BUN: 13 mg/dL (ref 6–23)
CHLORIDE: 96 meq/L (ref 96–112)
CO2: 28 mEq/L (ref 19–32)
Calcium: 9.6 mg/dL (ref 8.4–10.5)
Creatinine, Ser: 1.53 mg/dL — ABNORMAL HIGH (ref 0.40–1.50)
GFR: 64.51 mL/min (ref >60.00)
GLUCOSE: 518 mg/dL — AB (ref 70–99)
Potassium: 4 mEq/L (ref 3.5–5.1)
Sodium: 134 mEq/L — ABNORMAL LOW (ref 135–145)

## 2017-01-13 LAB — TESTOSTERONE: Testosterone: 147.52 ng/dL — ABNORMAL LOW (ref 300.00–890.00)

## 2017-01-13 LAB — URINALYSIS
BILIRUBIN URINE: NEGATIVE
Hgb urine dipstick: NEGATIVE
KETONES UR: NEGATIVE
Leukocytes, UA: NEGATIVE
Nitrite: NEGATIVE
PH: 5.5 (ref 5.0–8.0)
TOTAL PROTEIN, URINE-UPE24: NEGATIVE
Urobilinogen, UA: 0.2 (ref 0.0–1.0)

## 2017-01-13 LAB — VITAMIN B12: Vitamin B-12: 1226 pg/mL — ABNORMAL HIGH (ref 211–911)

## 2017-01-13 LAB — CBC WITH DIFFERENTIAL/PLATELET
BASOS PCT: 0.6 % (ref 0.0–3.0)
Basophils Absolute: 0 10*3/uL (ref 0.0–0.1)
Eosinophils Absolute: 0.1 10*3/uL (ref 0.0–0.7)
Eosinophils Relative: 2.1 % (ref 0.0–5.0)
HEMATOCRIT: 42.8 % (ref 39.0–52.0)
HEMOGLOBIN: 14.1 g/dL (ref 13.0–17.0)
LYMPHS ABS: 2 10*3/uL (ref 0.7–4.0)
LYMPHS PCT: 29.4 % (ref 12.0–46.0)
MCHC: 33 g/dL (ref 30.0–36.0)
MCV: 87.8 fl (ref 78.0–100.0)
Monocytes Absolute: 0.3 10*3/uL (ref 0.1–1.0)
Monocytes Relative: 4.9 % (ref 3.0–12.0)
NEUTROS ABS: 4.2 10*3/uL (ref 1.4–7.7)
Neutrophils Relative %: 63 % (ref 43.0–77.0)
PLATELETS: 232 10*3/uL (ref 150.0–400.0)
RBC: 4.88 Mil/uL (ref 4.22–5.81)
RDW: 12.9 % (ref 11.5–15.5)
WBC: 6.7 10*3/uL (ref 4.0–10.5)

## 2017-01-13 LAB — HEPATIC FUNCTION PANEL
ALBUMIN: 4.6 g/dL (ref 3.5–5.2)
ALT: 38 U/L (ref 0–53)
AST: 21 U/L (ref 0–37)
Alkaline Phosphatase: 76 U/L (ref 39–117)
BILIRUBIN TOTAL: 0.6 mg/dL (ref 0.2–1.2)
Bilirubin, Direct: 0.1 mg/dL (ref 0.0–0.3)
Total Protein: 7.4 g/dL (ref 6.0–8.3)

## 2017-01-13 LAB — LIPID PANEL
CHOL/HDL RATIO: 3
Cholesterol: 159 mg/dL (ref 0–200)
HDL: 45.9 mg/dL (ref >39.00)
LDL CALC: 80 mg/dL (ref 0–99)
NonHDL: 113.04
TRIGLYCERIDES: 165 mg/dL — AB (ref 0.0–149.0)
VLDL: 33 mg/dL (ref 0.0–40.0)

## 2017-01-13 LAB — TSH: TSH: 1.07 u[IU]/mL (ref 0.35–4.50)

## 2017-01-13 LAB — PSA: PSA: 1.03 ng/mL (ref 0.10–4.00)

## 2017-01-13 NOTE — Telephone Encounter (Signed)
Called with critical lab of [facility]. Reviewed chart---no visit since April and no recent labs Advised RN to get a hold of him and send him to ER Has renal insufficiency probably from dehydration

## 2017-01-15 ENCOUNTER — Telehealth: Payer: Self-pay | Admitting: Internal Medicine

## 2017-01-15 NOTE — Telephone Encounter (Signed)
Robert Bridges, Please inform the patient that his sugar was very high. He needs to see a med provider ASAP.  Thanks, AP

## 2017-01-16 ENCOUNTER — Encounter: Payer: Self-pay | Admitting: Family Medicine

## 2017-01-16 ENCOUNTER — Ambulatory Visit: Payer: BC Managed Care – PPO | Admitting: Internal Medicine

## 2017-01-16 ENCOUNTER — Ambulatory Visit (INDEPENDENT_AMBULATORY_CARE_PROVIDER_SITE_OTHER): Payer: BC Managed Care – PPO | Admitting: Family Medicine

## 2017-01-16 VITALS — BP 130/76 | Temp 98.6°F | Wt 285.0 lb

## 2017-01-16 DIAGNOSIS — E119 Type 2 diabetes mellitus without complications: Secondary | ICD-10-CM

## 2017-01-16 LAB — POCT GLYCOSYLATED HEMOGLOBIN (HGB A1C): Hemoglobin A1C: 12.6

## 2017-01-16 LAB — GLUCOSE, POCT (MANUAL RESULT ENTRY): POC Glucose: 345 mg/dl — AB (ref 70–99)

## 2017-01-16 MED ORDER — METFORMIN HCL 500 MG PO TABS: 500.0000 mg | ORAL_TABLET | Freq: Two times a day (BID) | ORAL | 0 refills | Status: DC

## 2017-01-16 NOTE — Telephone Encounter (Signed)
Patient called in, he saw a missed call on his phone----I have advised of critical lab value (glucose 518) that was resulted in Saturday clinic and asked if patient is still feeling ok (no confusion, dizziness, headache, nausea,sweating)---patient has remained asymptomatic---per dr Alain Marion, ok to add patient to schedule today at 1:30----patient advised to come in to our office today---or if he becomes symptomatic, go to ED

## 2017-01-16 NOTE — Addendum Note (Signed)
Addended by: Aggie Hacker A on: 01/16/2017 04:48 PM   Modules accepted: Orders

## 2017-01-16 NOTE — Patient Instructions (Signed)
WE NOW OFFER   [facility] FAST TRACK!!!  SAME DAY Appointments for ACUTE CARE  Such as: Sprains, Injuries, cuts, abrasions, rashes, muscle pain, joint pain, back pain Colds, flu, sore throats, headache, allergies, cough, fever  Ear pain, sinus and eye infections Abdominal pain, nausea, vomiting, diarrhea, upset stomach Animal/insect bites  3 Easy Ways to Schedule: Walk-In Scheduling Call in scheduling Mychart Sign-up: [email or web address]         

## 2017-01-16 NOTE — Progress Notes (Signed)
   Subjective:    Patient ID: Robert Lab Sr., male    DOB: [birth year], 41 y.o.   MRN: [id number]  HPI Here at the request of the triage service to assess a fasting glucose level of 518 which was obtained on 01-13-17. This was gathered along with a full set of screening labs. He has a strong family history of diabetes. He has never had abnormal glucoses before. He notes that these labs were drawn the morning after he celebrated his birthday by eating cake and drinking a lot of alcoholic beverages. This week he has felt very tired and his hands have been cramping. He has lost about 25 lbs in the past month without trying. He is thirsty and has to urinate frequently. Other wise his labs were remarkable only for a slightly elevated creatinine at 1.53 but with a normal GFR at 65.    Review of Systems  Constitutional: Positive for fatigue and unexpected weight change. Negative for activity change and appetite change.  Respiratory: Negative.   Cardiovascular: Negative.   Gastrointestinal: Negative.   Endocrine: Positive for polydipsia and polyuria.  Neurological: Negative.        Objective:   Physical Exam  Constitutional: He is oriented to person, place, and time. He appears well-developed and well-nourished. No distress.  Neck: No thyromegaly present.  Cardiovascular: Normal rate, regular rhythm, normal heart sounds and intact distal pulses.   Pulmonary/Chest: Effort normal and breath sounds normal. No respiratory distress. He has no wheezes. He has no rales.  Lymphadenopathy:    He has no cervical adenopathy.  Neurological: He is alert and oriented to person, place, and time.          Assessment & Plan:  Newly diagnosed type 2 diabetes. A random glucose today is 345 and his A1c is 12.6. We discussed dietary advice such as reducing his sugar and carb intake. Advised him to drink plenty of water. Start on Metformin 500 mg bid. He is scheduled to see Dr. Alain Marion later this week for a  well exam, so he can follow up with Dr. Alain Marion for the diabetes as well. That would be a good time to repeat a BMET also.  Alysia Penna, MD

## 2017-01-19 ENCOUNTER — Encounter: Payer: Self-pay | Admitting: Internal Medicine

## 2017-01-19 ENCOUNTER — Ambulatory Visit (INDEPENDENT_AMBULATORY_CARE_PROVIDER_SITE_OTHER): Payer: BC Managed Care – PPO | Admitting: Internal Medicine

## 2017-01-19 DIAGNOSIS — Z0000 Encounter for general adult medical examination without abnormal findings: Secondary | ICD-10-CM | POA: Diagnosis not present

## 2017-01-19 DIAGNOSIS — E559 Vitamin D deficiency, unspecified: Secondary | ICD-10-CM

## 2017-01-19 DIAGNOSIS — E538 Deficiency of other specified B group vitamins: Secondary | ICD-10-CM

## 2017-01-19 DIAGNOSIS — E118 Type 2 diabetes mellitus with unspecified complications: Secondary | ICD-10-CM | POA: Diagnosis not present

## 2017-01-19 DIAGNOSIS — E1165 Type 2 diabetes mellitus with hyperglycemia: Secondary | ICD-10-CM | POA: Insufficient documentation

## 2017-01-19 DIAGNOSIS — I10 Essential (primary) hypertension: Secondary | ICD-10-CM

## 2017-01-19 DIAGNOSIS — IMO0002 Reserved for concepts with insufficient information to code with codable children: Secondary | ICD-10-CM

## 2017-01-19 MED ORDER — CANAGLIFLOZIN-METFORMIN HCL ER 50-500 MG PO TB24: 1.0000 | ORAL_TABLET | Freq: Two times a day (BID) | ORAL | 11 refills | Status: DC

## 2017-01-19 MED ORDER — ONETOUCH ULTRALINK W/DEVICE KIT: PACK | 3 refills | Status: DC

## 2017-01-19 MED ORDER — ONETOUCH LANCETS MISC: 3 refills | Status: DC

## 2017-01-19 MED ORDER — GLUCOSE BLOOD VI STRP: ORAL_STRIP | 12 refills | Status: DC

## 2017-01-19 NOTE — Assessment & Plan Note (Signed)
Labs

## 2017-01-19 NOTE — Progress Notes (Signed)
Subjective:  Patient ID: Robert Lab Sr., male    DOB: 10/13/1975  Age: 40 y.o. MRN: [id number]  CC: No chief complaint on file.   HPI Robert Coleman Sr. presents for a well exam He has had very high sugar >500. He had diarrhea w/it  Outpatient Medications Prior to Visit  Medication Sig Dispense Refill  . amLODipine (NORVASC) 10 MG tablet Take 1 tablet (10 mg total) by mouth daily. 90 tablet 3  . aspirin-acetaminophen-caffeine (EXCEDRIN MIGRAINE) 381-829-93 MG per tablet Take 2 tablets by mouth as needed.      . cetirizine (ZYRTEC ALLERGY) 10 MG tablet Take 1 tablet (10 mg total) by mouth daily. 30 tablet 11  . ciclopirox (PENLAC) 8 % solution Apply topically at bedtime. Apply over nail and surrounding skin. Apply daily over previous coat. After seven (7) days, may remove with alcohol and continue cycle. 6.6 mL 0  . Ferrous Sulfate (IRON SUPPLEMENT PO) Take 1 each by mouth daily.    . fluticasone (FLONASE) 50 MCG/ACT nasal spray Place 2 sprays into both nostrils daily. 16 g 6  . hydrocortisone-pramoxine (PROCTOFOAM-HC) rectal foam Place 1 applicator rectally 2 (two) times daily. As needed.     . lansoprazole (PREVACID) 30 MG capsule Take 30 mg by mouth as needed.      Marland Kitchen levocetirizine (XYZAL) 5 MG tablet TAKE 1 TABLET (5 MG TOTAL) BY MOUTH EVERY EVENING. 90 tablet 2  . metFORMIN (GLUCOPHAGE) 500 MG tablet Take 1 tablet (500 mg total) by mouth 2 (two) times daily with a meal. 60 tablet 0  . multivitamin (ONE-A-DAY MEN'S) TABS tablet Take 1 tablet by mouth daily.    Marland Kitchen omeprazole (PRILOSEC) 40 MG capsule Take 1 capsule (40 mg total) by mouth daily. 14 capsule 0  . telmisartan (MICARDIS) 80 MG tablet Take 1 tablet (80 mg total) by mouth daily. 90 tablet 3  . telmisartan-hydrochlorothiazide (MICARDIS HCT) 80-25 MG tablet Take 1 tablet by mouth daily. 90 tablet 3  . fluticasone (FLONASE) 50 MCG/ACT nasal spray Place 2 sprays into the nose daily. 16 g 6  . levocetirizine (XYZAL) 5 MG tablet  TAKE 1 TABLET (5 MG TOTAL) BY MOUTH EVERY EVENING. 30 tablet 5  . methylPREDNISolone (MEDROL DOSEPAK) 4 MG TBPK tablet As directed 21 tablet 0  . cholecalciferol (VITAMIN D) 1000 UNITS tablet Take 1 tablet (1,000 Units total) by mouth every other day. 200 tablet 3  . tadalafil (CIALIS) 20 MG tablet Take 1 tablet (20 mg total) by mouth daily as needed for erectile dysfunction. 10 tablet 5   No facility-administered medications prior to visit.     ROS Review of Systems  Constitutional: Positive for unexpected weight change. Negative for appetite change.  HENT: Negative for congestion, nosebleeds, sneezing, sore throat and trouble swallowing.   Eyes: Negative for itching and visual disturbance.  Respiratory: Negative for cough.   Cardiovascular: Negative for chest pain, palpitations and leg swelling.  Gastrointestinal: Negative for abdominal distention, blood in stool, diarrhea and nausea.  Genitourinary: Positive for frequency. Negative for hematuria.  Musculoskeletal: Negative for back pain, gait problem, joint swelling and neck pain.  Skin: Negative for rash.  Neurological: Negative for dizziness, tremors, speech difficulty and weakness.  Psychiatric/Behavioral: Negative for agitation, dysphoric mood, sleep disturbance and suicidal ideas. The patient is not nervous/anxious.     Objective:  BP 128/68 (BP Location: Left Arm, Patient Position: Sitting, Cuff Size: Large)   Pulse 86   Temp 98.5 F (36.9 C) (Oral)  Ht 6\' 4"  (1.93 m)   Wt 283 lb (128.4 kg)   SpO2 98%   BMI 34.45 kg/m   BP Readings from Last 3 Encounters:  01/19/17 128/68  01/16/17 130/76  10/06/16 128/82    Wt Readings from Last 3 Encounters:  01/19/17 283 lb (128.4 kg)  01/16/17 285 lb (129.3 kg)  10/06/16 295 lb 0.6 oz (133.8 kg)    Physical Exam  Constitutional: He is oriented to person, place, and time. He appears well-developed and well-nourished. No distress.  HENT:  Head: Normocephalic and  atraumatic.  Right Ear: External ear normal.  Left Ear: External ear normal.  Nose: Nose normal.  Mouth/Throat: Oropharynx is clear and moist. No oropharyngeal exudate.  Eyes: Pupils are equal, round, and reactive to light. Conjunctivae and EOM are normal. Right eye exhibits no discharge. Left eye exhibits no discharge. No scleral icterus.  Neck: Normal range of motion. Neck supple. No JVD present. No tracheal deviation present. No thyromegaly present.  Cardiovascular: Normal rate, regular rhythm, normal heart sounds and intact distal pulses.  Exam reveals no gallop and no friction rub.   No murmur heard. Pulmonary/Chest: Effort normal and breath sounds normal. No stridor. No respiratory distress. He has no wheezes. He has no rales. He exhibits no tenderness.  Abdominal: Soft. Bowel sounds are normal. He exhibits no distension and no mass. There is no tenderness. There is no rebound and no guarding.  Genitourinary: No penile tenderness.  Musculoskeletal: Normal range of motion. He exhibits no edema or tenderness.  Lymphadenopathy:    He has no cervical adenopathy.  Neurological: He is alert and oriented to person, place, and time. He has normal reflexes. No cranial nerve deficit. He exhibits normal muscle tone. Coordination normal.  Skin: Skin is warm and dry. No rash noted. He is not diaphoretic. No erythema. No pallor.  Psychiatric: He has a normal mood and affect. His behavior is normal. Judgment and thought content normal.    Lab Results  Component Value Date   WBC 6.7 01/13/2017   HGB 14.1 01/13/2017   HCT 42.8 01/13/2017   PLT 232.0 01/13/2017   GLUCOSE 518 (HH) 01/13/2017   CHOL 159 01/13/2017   TRIG 165.0 (H) 01/13/2017   HDL 45.90 01/13/2017   LDLCALC 80 01/13/2017   ALT 38 01/13/2017   AST 21 01/13/2017   NA 134 (L) 01/13/2017   K 4.0 01/13/2017   CL 96 01/13/2017   CREATININE 1.53 (H) 01/13/2017   BUN 13 01/13/2017   CO2 28 01/13/2017   TSH 1.07 01/13/2017   PSA  1.03 01/13/2017   INR 1.13 04/06/2009   HGBA1C 12.6 01/16/2017    No results found.  Assessment & Plan:   There are no diagnoses linked to this encounter. I have discontinued Mr. Macaluso's methylPREDNISolone. I am also having him maintain his aspirin-acetaminophen-caffeine, lansoprazole, hydrocortisone-pramoxine, tadalafil, cholecalciferol, Ferrous Sulfate (IRON SUPPLEMENT PO), multivitamin, telmisartan, ciclopirox, telmisartan-hydrochlorothiazide, amLODipine, omeprazole, cetirizine, fluticasone, levocetirizine, and metFORMIN.  No orders of the defined types were placed in this encounter.    Follow-up: No Follow-up on file.  Walker Kehr, MD

## 2017-01-19 NOTE — Assessment & Plan Note (Signed)
BP Readings from Last 3 Encounters:  01/19/17 128/68  01/16/17 130/76  10/06/16 128/82  Micardis

## 2017-01-19 NOTE — Assessment & Plan Note (Signed)
D/c metformin Invokamet XR

## 2017-01-19 NOTE — Assessment & Plan Note (Signed)
We discussed age appropriate health related issues, including available/recomended screening tests and vaccinations. We discussed a need for adhering to healthy diet and exercise. Labs were ordered to be later reviewed . All questions were answered.  Labs in 3 mo

## 2017-01-20 ENCOUNTER — Encounter: Payer: BC Managed Care – PPO | Attending: Internal Medicine | Admitting: *Deleted

## 2017-01-20 DIAGNOSIS — E538 Deficiency of other specified B group vitamins: Secondary | ICD-10-CM | POA: Insufficient documentation

## 2017-01-20 DIAGNOSIS — E118 Type 2 diabetes mellitus with unspecified complications: Secondary | ICD-10-CM | POA: Diagnosis present

## 2017-01-20 DIAGNOSIS — E1165 Type 2 diabetes mellitus with hyperglycemia: Secondary | ICD-10-CM

## 2017-01-20 DIAGNOSIS — E559 Vitamin D deficiency, unspecified: Secondary | ICD-10-CM | POA: Insufficient documentation

## 2017-01-20 DIAGNOSIS — IMO0002 Reserved for concepts with insufficient information to code with codable children: Secondary | ICD-10-CM

## 2017-01-20 NOTE — Progress Notes (Signed)
Diabetes Self-Management Education  Visit Type: First/Initial  Appt. Start Time: 0830 Appt. End Time: 0930  01/20/2017  [name], identified by name and date of birth, is a 41 y.o. male with a diagnosis of Diabetes: Type 2.  He was diagnosed a couple of weeks ago with BG over 500 mg/dl. Follow up appointment 3 days later BG was better and Metformin was started. Patient had significant diarrhea with the Metformin so MD has changed to Invokanamet which patient states he will start taking today. He stated he did not know how to use a BG meter so I plan to review that with him today.  ASSESSMENT  There were no vitals taken for this visit. There is no height or weight on file to calculate BMI.      Diabetes Self-Management Education - 01/20/17 0818      Visit Information   Visit Type First/Initial     Initial Visit   Diabetes Type Type 2   Are you taking your medications as prescribed? No   Date Diagnosed 01/19/2017     Health Coping   How would you rate your overall health? Excellent     Psychosocial Assessment   Patient Belief/Attitude about Diabetes Motivated to manage diabetes   Other persons present Patient   Patient Concerns Glycemic Control   Special Needs None   Learning Readiness Contemplating   How often do you need to have someone help you when you read instructions, pamphlets, or other written materials from your doctor or pharmacy? 1 - Never   What is the last grade level you completed in school? 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 Needs Instruction   Patient understands incorporating nutritional management into lifestyle. Needs Instruction   Patient undertands incorporating physical activity into lifestyle. Needs Instruction   Patient understands using medications safely. Needs Instruction   Patient  understands monitoring blood glucose, interpreting and using results Needs Instruction   Patient understands prevention, detection, and treatment of acute complications. Needs Instruction   Patient understands prevention, detection, and treatment of chronic complications. Needs Instruction   Patient understands how to develop strategies to address psychosocial issues. Needs Instruction   Patient understands how to develop strategies to promote health/change behavior. Needs Instruction     Complications   Last HgB A1C per patient/outside source 12.6 %   How often do you check your blood sugar? 0 times/day (not testing)  Has Rx for meter but has not had time to pick it up yet   Have you had a dilated eye exam in the past 12 months? No   Have you had a dental exam in the past 12 months? No   Are you checking your feet? No     Dietary Intake   Breakfast bacon, eggs OR yogurt OR  protein shake OR pop tarts OR granola bar   Snack (morning) not usually unless pork skins   Lunch school lunch: burgers  OR meatloaf, starch, vegetables, fresh fruit if offered    Snack (afternoon) same as AM   Dinner [employer] grilled chicken wrap, 4 nuggets, chili, 1/2 and half tea OR lean meat, small serving of starch   Snack (evening) not usually    Beverage(s) used to drink OJ and apple juice, but stopped now. diet soda, Gatorade or same, 2 a day, water  Exercise   Exercise Type Light (walking / raking leaves)   How many days per week to you exercise? 7   How many minutes per day do you exercise? 120   Total minutes per week of exercise 840     Patient Education   Previous Diabetes Education No   Disease state  Definition of diabetes, type 1 and 2, and the diagnosis of diabetes   Nutrition management  Carbohydrate counting;Food label reading, portion sizes and measuring food.   Physical activity and exercise  Role of exercise on diabetes management, blood pressure control and cardiac health.   Medications  Reviewed patients medication for diabetes, action, purpose, timing of dose and side effects.   Monitoring Identified appropriate SMBG and/or A1C goals.   Chronic complications Relationship between chronic complications and blood glucose control   Psychosocial adjustment Role of stress on diabetes     Individualized Goals (developed by patient)   Nutrition General guidelines for healthy choices and portions discussed   Physical Activity Exercise 3-5 times per week   Medications take my medication as prescribed   Monitoring  test my blood glucose as discussed     Post-Education Assessment   Patient understands the diabetes disease and treatment process. Demonstrates understanding / competency   Patient understands incorporating nutritional management into lifestyle. Needs Review   Patient undertands incorporating physical activity into lifestyle. Demonstrates understanding / competency   Patient understands using medications safely. Demonstrates understanding / competency   Patient understands monitoring blood glucose, interpreting and using results Needs Review   Patient understands prevention, detection, and treatment of acute complications. Demonstrates understanding / competency     Outcomes   Expected Outcomes Demonstrated interest in learning. Expect positive outcomes   Future DMSE 4-6 wks   Program Status Not Completed      Individualized Plan for Diabetes Self-Management Training:   Learning Objective:  Patient will have a greater understanding of diabetes self-management. Patient education plan is to attend individual and/or group sessions per assessed needs and concerns.  I demonstrated use of BG meter today and encouraged him to double check with his insurance company to make sure the meter prescribed will be covered. We did not complete all topics of visit today, plan to follow up with more information on nutrition portion and A1c at next visit. Plan:   Patient Instructions   Plan:  Aim for 4 Carb Choices per meal (60 grams) +/- 1 either way  Aim for 0-2 Carbs per snack if hungry  Include protein in moderation with your meals and snacks Consider reading food labels for Total Carbohydrate of foods Continue with your activity level by walking daily as tolerated Consider checking BG at alternate times per day  Continue taking medication as directed by MD  Expected Outcomes:  Demonstrated interest in learning. Expect positive outcomes  Education material provided: Living Well with Diabetes, Meal plan card and Carbohydrate counting sheet Diabetes medication handout  If problems or questions, patient to contact team via:  Phone  Future DSME appointment: 4-6 wks

## 2017-01-20 NOTE — Patient Instructions (Signed)
Plan:  Aim for 4 Carb Choices per meal (60 grams) +/- 1 either way  Aim for 0-2 Carbs per snack if hungry  Include protein in moderation with your meals and snacks Consider reading food labels for Total Carbohydrate of foods Continue with your activity level by walking daily as tolerated Consider checking BG at alternate times per day  Continue taking medication as directed by MD

## 2017-01-26 ENCOUNTER — Telehealth: Payer: Self-pay

## 2017-01-26 MED ORDER — DAPAGLIFLOZIN PRO-METFORMIN ER 5-1000 MG PO TB24: 1.0000 | ORAL_TABLET | Freq: Every day | ORAL | 11 refills | Status: DC

## 2017-01-26 NOTE — Telephone Encounter (Signed)
Pls PA Xigduo XR Give him card/30 d free coupon/Rx Thx

## 2017-01-26 NOTE — Telephone Encounter (Signed)
Insurance stated that the invoamet xr is not covered.  Please advise if we should do a PA or if xigduo is an appropriate alternative.

## 2017-01-27 NOTE — Telephone Encounter (Signed)
Spoke to PCP. He is sending in xigduo as alternative.

## 2017-01-27 NOTE — Telephone Encounter (Signed)
Pt informed and copay card placed up front for patient to pick up.

## 2017-02-07 MED ORDER — DAPAGLIFLOZIN PRO-METFORMIN ER 5-1000 MG PO TB24: 1.0000 | ORAL_TABLET | Freq: Every day | ORAL | 11 refills | Status: DC

## 2017-02-07 NOTE — Telephone Encounter (Signed)
Resent rx to [facility] 

## 2017-02-07 NOTE — Addendum Note (Signed)
Addended by: Earnstine Regal on: 02/07/2017 10:07 AM   Modules accepted: Orders

## 2017-02-07 NOTE — Telephone Encounter (Signed)
Pharmacy never received Dapagliflozin-Metformin HCl ER (XIGDUO XR) 10-998 MG TB24  Please resend

## 2017-02-12 ENCOUNTER — Other Ambulatory Visit: Payer: Self-pay | Admitting: Family Medicine

## 2017-03-12 ENCOUNTER — Other Ambulatory Visit: Payer: Self-pay | Admitting: Internal Medicine

## 2017-04-28 ENCOUNTER — Ambulatory Visit: Payer: BC Managed Care – PPO | Admitting: Internal Medicine

## 2017-04-28 ENCOUNTER — Encounter: Payer: Self-pay | Admitting: Internal Medicine

## 2017-04-28 ENCOUNTER — Other Ambulatory Visit (INDEPENDENT_AMBULATORY_CARE_PROVIDER_SITE_OTHER): Payer: BC Managed Care – PPO

## 2017-04-28 DIAGNOSIS — I10 Essential (primary) hypertension: Secondary | ICD-10-CM

## 2017-04-28 DIAGNOSIS — E1165 Type 2 diabetes mellitus with hyperglycemia: Secondary | ICD-10-CM

## 2017-04-28 DIAGNOSIS — J019 Acute sinusitis, unspecified: Secondary | ICD-10-CM | POA: Insufficient documentation

## 2017-04-28 DIAGNOSIS — E559 Vitamin D deficiency, unspecified: Secondary | ICD-10-CM

## 2017-04-28 DIAGNOSIS — IMO0002 Reserved for concepts with insufficient information to code with codable children: Secondary | ICD-10-CM

## 2017-04-28 DIAGNOSIS — J0100 Acute maxillary sinusitis, unspecified: Secondary | ICD-10-CM

## 2017-04-28 DIAGNOSIS — E118 Type 2 diabetes mellitus with unspecified complications: Secondary | ICD-10-CM | POA: Diagnosis not present

## 2017-04-28 LAB — HEPATIC FUNCTION PANEL
ALBUMIN: 4.9 g/dL (ref 3.5–5.2)
ALK PHOS: 50 U/L (ref 39–117)
ALT: 39 U/L (ref 0–53)
AST: 25 U/L (ref 0–37)
BILIRUBIN DIRECT: 0.2 mg/dL (ref 0.0–0.3)
TOTAL PROTEIN: 8 g/dL (ref 6.0–8.3)
Total Bilirubin: 0.6 mg/dL (ref 0.2–1.2)

## 2017-04-28 LAB — BASIC METABOLIC PANEL
BUN: 17 mg/dL (ref 6–23)
CALCIUM: 10.2 mg/dL (ref 8.4–10.5)
CO2: 28 meq/L (ref 19–32)
CREATININE: 1.45 mg/dL (ref 0.40–1.50)
Chloride: 103 mEq/L (ref 96–112)
GFR: 68.54 mL/min (ref >60.00)
GLUCOSE: 118 mg/dL — AB (ref 70–99)
Potassium: 3.8 mEq/L (ref 3.5–5.1)
Sodium: 141 mEq/L (ref 135–145)

## 2017-04-28 LAB — VITAMIN D 25 HYDROXY (VIT D DEFICIENCY, FRACTURES): VITD: 39.07 ng/mL (ref 30.00–100.00)

## 2017-04-28 LAB — HEMOGLOBIN A1C: Hgb A1c MFr Bld: 7.3 % — ABNORMAL HIGH (ref 4.6–6.5)

## 2017-04-28 MED ORDER — CEFDINIR 300 MG PO CAPS
300.0000 mg | ORAL_CAPSULE | Freq: Two times a day (BID) | ORAL | 0 refills | Status: DC
Start: 2017-04-28 — End: 2017-07-26

## 2017-04-28 MED ORDER — LORATADINE 10 MG PO TABS: 10.0000 mg | ORAL_TABLET | Freq: Every day | ORAL | 3 refills | Status: DC

## 2017-04-28 MED ORDER — DAPAGLIFLOZIN PRO-METFORMIN ER 5-1000 MG PO TB24: 1.0000 | ORAL_TABLET | Freq: Every day | ORAL | 3 refills | Status: DC

## 2017-04-28 MED ORDER — OMEPRAZOLE 40 MG PO CPDR: 40.0000 mg | DELAYED_RELEASE_CAPSULE | Freq: Every day | ORAL | 3 refills | Status: DC

## 2017-04-28 MED ORDER — TADALAFIL 20 MG PO TABS: 20.0000 mg | ORAL_TABLET | Freq: Every day | ORAL | 11 refills | Status: DC | PRN

## 2017-04-28 NOTE — Assessment & Plan Note (Signed)
Norvasc Micardis

## 2017-04-28 NOTE — Progress Notes (Signed)
Subjective:  Patient ID: Robert Lab Sr., male    DOB: 09-Jun-1976  Age: 41 y.o. MRN: [id number]  CC: No chief complaint on file.   HPI Robert Lab Sr. presents for HTN, DM, ED f/u. C/o URI sx's x 1 weeks  Outpatient Medications Prior to Visit  Medication Sig Dispense Refill  . amLODipine (NORVASC) 10 MG tablet TAKE 1 TABLET (10 MG TOTAL) BY MOUTH DAILY. 90 tablet 3  . aspirin-acetaminophen-caffeine (EXCEDRIN MIGRAINE) 341-962-22 MG per tablet Take 2 tablets by mouth as needed.      . Blood Glucose Monitoring Suppl (ONETOUCH ULTRALINK) w/Device KIT As directed 1 each 3  . cetirizine (ZYRTEC ALLERGY) 10 MG tablet Take 1 tablet (10 mg total) by mouth daily. 30 tablet 11  . ciclopirox (PENLAC) 8 % solution Apply topically at bedtime. Apply over nail and surrounding skin. Apply daily over previous coat. After seven (7) days, may remove with alcohol and continue cycle. 6.6 mL 0  . Dapagliflozin-Metformin HCl ER (XIGDUO XR) 10-998 MG TB24 Take 1 tablet by mouth daily. 30 tablet 11  . Ferrous Sulfate (IRON SUPPLEMENT PO) Take 1 each by mouth daily.    . fluticasone (FLONASE) 50 MCG/ACT nasal spray Place 2 sprays into both nostrils daily. 16 g 6  . glucose blood test strip Use as instructed 50 each 12  . hydrocortisone-pramoxine (PROCTOFOAM-HC) rectal foam Place 1 applicator rectally 2 (two) times daily. As needed.     . lansoprazole (PREVACID) 30 MG capsule Take 30 mg by mouth as needed.      Marland Kitchen levocetirizine (XYZAL) 5 MG tablet TAKE 1 TABLET (5 MG TOTAL) BY MOUTH EVERY EVENING. 90 tablet 2  . multivitamin (ONE-A-DAY MEN'S) TABS tablet Take 1 tablet by mouth daily.    Marland Kitchen omeprazole (PRILOSEC) 40 MG capsule Take 1 capsule (40 mg total) by mouth daily. 14 capsule 0  . ONE TOUCH LANCETS MISC As directed 100 each 3  . telmisartan (MICARDIS) 80 MG tablet Take 1 tablet (80 mg total) by mouth daily. 90 tablet 3  . telmisartan-hydrochlorothiazide (MICARDIS HCT) 80-25 MG tablet TAKE 1 TABLET BY  MOUTH DAILY. 90 tablet 3  . cholecalciferol (VITAMIN D) 1000 UNITS tablet Take 1 tablet (1,000 Units total) by mouth every other day. 200 tablet 3  . tadalafil (CIALIS) 20 MG tablet Take 1 tablet (20 mg total) by mouth daily as needed for erectile dysfunction. 10 tablet 5   No facility-administered medications prior to visit.     ROS Review of Systems  Constitutional: Negative for appetite change, fatigue and unexpected weight change.  HENT: Positive for congestion, postnasal drip and rhinorrhea. Negative for nosebleeds, sneezing, sore throat and trouble swallowing.   Eyes: Negative for itching and visual disturbance.  Respiratory: Negative for cough.   Cardiovascular: Negative for chest pain, palpitations and leg swelling.  Gastrointestinal: Negative for abdominal distention, blood in stool, diarrhea and nausea.  Genitourinary: Negative for frequency and hematuria.  Musculoskeletal: Negative for back pain, gait problem, joint swelling and neck pain.  Skin: Negative for rash.  Neurological: Negative for dizziness, tremors, speech difficulty and weakness.  Psychiatric/Behavioral: Negative for agitation, dysphoric mood and sleep disturbance. The patient is not nervous/anxious.     Objective:  BP 124/76 (BP Location: Left Arm, Patient Position: Sitting, Cuff Size: Large)   Pulse 73   Temp 98.3 F (36.8 C) (Oral)   Ht _0  (1.93 m)   Wt 292 lb (132.5 kg)   SpO2 98%   BMI 35.54 kg/m  BP Readings from Last 3 Encounters:  04/28/17 124/76  01/19/17 128/68  01/16/17 130/76    Wt Readings from Last 3 Encounters:  04/28/17 292 lb (132.5 kg)  01/19/17 283 lb (128.4 kg)  01/16/17 285 lb (129.3 kg)    Physical Exam  Constitutional: He is oriented to person, place, and time. He appears well-developed. No distress.  NAD  HENT:  Mouth/Throat: Oropharynx is clear and moist.  Eyes: Conjunctivae are normal. Pupils are equal, round, and reactive to light.  Neck: Normal range of  motion. No JVD present. No thyromegaly present.  Cardiovascular: Normal rate, regular rhythm, normal heart sounds and intact distal pulses. Exam reveals no gallop and no friction rub.  No murmur heard. Pulmonary/Chest: Effort normal and breath sounds normal. No respiratory distress. He has no wheezes. He has no rales. He exhibits no tenderness.  Abdominal: Soft. Bowel sounds are normal. He exhibits no distension and no mass. There is no tenderness. There is no rebound and no guarding.  Musculoskeletal: Normal range of motion. He exhibits no edema or tenderness.  Lymphadenopathy:    He has no cervical adenopathy.  Neurological: He is alert and oriented to person, place, and time. He has normal reflexes. No cranial nerve deficit. He exhibits normal muscle tone. He displays a negative Romberg sign. Coordination and gait normal.  Skin: Skin is warm and dry. No rash noted.  Psychiatric: He has a normal mood and affect. His behavior is normal. Judgment and thought content normal.  eryth nasal mucosa  Lab Results  Component Value Date   WBC 6.7 01/13/2017   HGB 14.1 01/13/2017   HCT 42.8 01/13/2017   PLT 232.0 01/13/2017   GLUCOSE 518 (HH) 01/13/2017   CHOL 159 01/13/2017   TRIG 165.0 (H) 01/13/2017   HDL 45.90 01/13/2017   LDLCALC 80 01/13/2017   ALT 38 01/13/2017   AST 21 01/13/2017   NA 134 (L) 01/13/2017   K 4.0 01/13/2017   CL 96 01/13/2017   CREATININE 1.53 (H) 01/13/2017   BUN 13 01/13/2017   CO2 28 01/13/2017   TSH 1.07 01/13/2017   PSA 1.03 01/13/2017   INR 1.13 04/06/2009   HGBA1C 12.6 01/16/2017    No results found.  Assessment & Plan:   There are no diagnoses linked to this encounter. I am having Robert Lab Sr. maintain his aspirin-acetaminophen-caffeine, lansoprazole, hydrocortisone-pramoxine, tadalafil, cholecalciferol, Ferrous Sulfate (IRON SUPPLEMENT PO), multivitamin, telmisartan, ciclopirox, omeprazole, cetirizine, fluticasone, levocetirizine, ONETOUCH  ULTRALINK, glucose blood, ONE TOUCH LANCETS, Dapagliflozin-Metformin HCl ER, amLODipine, and telmisartan-hydrochlorothiazide.  No orders of the defined types were placed in this encounter.    Follow-up: No Follow-up on file.  Walker Kehr, MD

## 2017-04-28 NOTE — Assessment & Plan Note (Signed)
Labs

## 2017-04-28 NOTE — Assessment & Plan Note (Signed)
Omnicef x 10 d 

## 2017-07-02 ENCOUNTER — Encounter: Payer: Self-pay | Admitting: Internal Medicine

## 2017-07-26 ENCOUNTER — Ambulatory Visit: Payer: BC Managed Care – PPO | Admitting: Internal Medicine

## 2017-07-26 ENCOUNTER — Other Ambulatory Visit (INDEPENDENT_AMBULATORY_CARE_PROVIDER_SITE_OTHER): Payer: BC Managed Care – PPO

## 2017-07-26 ENCOUNTER — Encounter: Payer: Self-pay | Admitting: Internal Medicine

## 2017-07-26 VITALS — BP 128/82 | HR 73 | Temp 98.4°F | Ht 76.0 in | Wt 293.0 lb

## 2017-07-26 DIAGNOSIS — M1712 Unilateral primary osteoarthritis, left knee: Secondary | ICD-10-CM | POA: Diagnosis not present

## 2017-07-26 DIAGNOSIS — E1165 Type 2 diabetes mellitus with hyperglycemia: Secondary | ICD-10-CM | POA: Diagnosis not present

## 2017-07-26 DIAGNOSIS — I10 Essential (primary) hypertension: Secondary | ICD-10-CM

## 2017-07-26 DIAGNOSIS — E538 Deficiency of other specified B group vitamins: Secondary | ICD-10-CM | POA: Diagnosis not present

## 2017-07-26 DIAGNOSIS — K219 Gastro-esophageal reflux disease without esophagitis: Secondary | ICD-10-CM | POA: Diagnosis not present

## 2017-07-26 DIAGNOSIS — J301 Allergic rhinitis due to pollen: Secondary | ICD-10-CM

## 2017-07-26 LAB — BASIC METABOLIC PANEL
BUN: 17 mg/dL (ref 6–23)
CHLORIDE: 102 meq/L (ref 96–112)
CO2: 28 meq/L (ref 19–32)
Calcium: 10 mg/dL (ref 8.4–10.5)
Creatinine, Ser: 1.55 mg/dL — ABNORMAL HIGH (ref 0.40–1.50)
GFR: 63.39 mL/min (ref >60.00)
GLUCOSE: 186 mg/dL — AB (ref 70–99)
Potassium: 4 mEq/L (ref 3.5–5.1)
SODIUM: 140 meq/L (ref 135–145)

## 2017-07-26 LAB — HEMOGLOBIN A1C: Hgb A1c MFr Bld: 7.5 % — ABNORMAL HIGH (ref 4.6–6.5)

## 2017-07-26 MED ORDER — MONTELUKAST SODIUM 10 MG PO TABS: 10.0000 mg | ORAL_TABLET | Freq: Every day | ORAL | 11 refills | Status: DC

## 2017-07-26 NOTE — Progress Notes (Signed)
Subjective:  Patient ID: Robert Lab Sr., male    DOB: 1976-01-20  Age: 42 y.o. MRN: [id number]  CC: No chief complaint on file.   HPI Robert Lab Sr. presents for HTN, DM C/o throat tickling since last time - abx helped, than it came back   Outpatient Medications Prior to Visit  Medication Sig Dispense Refill  . amLODipine (NORVASC) 10 MG tablet TAKE 1 TABLET (10 MG TOTAL) BY MOUTH DAILY. 90 tablet 3  . aspirin-acetaminophen-caffeine (EXCEDRIN MIGRAINE) 993-570-17 MG per tablet Take 2 tablets by mouth as needed.      . Blood Glucose Monitoring Suppl (ONETOUCH ULTRALINK) w/Device KIT As directed 1 each 3  . ciclopirox (PENLAC) 8 % solution Apply topically at bedtime. Apply over nail and surrounding skin. Apply daily over previous coat. After seven (7) days, may remove with alcohol and continue cycle. 6.6 mL 0  . Dapagliflozin-Metformin HCl ER (XIGDUO XR) 10-998 MG TB24 Take 1 tablet daily by mouth. 90 tablet 3  . Ferrous Sulfate (IRON SUPPLEMENT PO) Take 1 each by mouth daily.    . fluticasone (FLONASE) 50 MCG/ACT nasal spray Place 2 sprays into both nostrils daily. 16 g 6  . glucose blood test strip Use as instructed 50 each 12  . hydrocortisone-pramoxine (PROCTOFOAM-HC) rectal foam Place 1 applicator rectally 2 (two) times daily. As needed.     . lansoprazole (PREVACID) 30 MG capsule Take 30 mg by mouth as needed.      Marland Kitchen levocetirizine (XYZAL) 5 MG tablet TAKE 1 TABLET (5 MG TOTAL) BY MOUTH EVERY EVENING. 90 tablet 2  . loratadine (CLARITIN) 10 MG tablet Take 1 tablet (10 mg total) daily by mouth. 100 tablet 3  . multivitamin (ONE-A-DAY MEN'S) TABS tablet Take 1 tablet by mouth daily.    Marland Kitchen omeprazole (PRILOSEC) 40 MG capsule Take 1 capsule (40 mg total) daily by mouth. 90 capsule 3  . ONE TOUCH LANCETS MISC As directed 100 each 3  . telmisartan-hydrochlorothiazide (MICARDIS HCT) 80-25 MG tablet TAKE 1 TABLET BY MOUTH DAILY. 90 tablet 3  . cholecalciferol (VITAMIN D) 1000 UNITS  tablet Take 1 tablet (1,000 Units total) by mouth every other day. 200 tablet 3  . tadalafil (CIALIS) 20 MG tablet Take 1 tablet (20 mg total) daily as needed by mouth for erectile dysfunction. 30 tablet 11  . cefdinir (OMNICEF) 300 MG capsule Take 1 capsule (300 mg total) 2 (two) times daily by mouth. 20 capsule 0   No facility-administered medications prior to visit.     ROS Review of Systems  Objective:  BP 128/82 (BP Location: Left Arm, Patient Position: Sitting, Cuff Size: Large)   Pulse 73   Temp 98.4 F (36.9 C) (Oral)   Ht '6\' 4"'$  (1.93 m)   Wt 293 lb (132.9 kg)   SpO2 98%   BMI 35.67 kg/m   BP Readings from Last 3 Encounters:  07/26/17 128/82  04/28/17 124/76  01/19/17 128/68    Wt Readings from Last 3 Encounters:  07/26/17 293 lb (132.9 kg)  04/28/17 292 lb (132.5 kg)  01/19/17 283 lb (128.4 kg)    Physical Exam  Lab Results  Component Value Date   WBC 6.7 01/13/2017   HGB 14.1 01/13/2017   HCT 42.8 01/13/2017   PLT 232.0 01/13/2017   GLUCOSE 118 (H) 04/28/2017   CHOL 159 01/13/2017   TRIG 165.0 (H) 01/13/2017   HDL 45.90 01/13/2017   LDLCALC 80 01/13/2017   ALT 39 04/28/2017   AST  25 04/28/2017   NA 141 04/28/2017   K 3.8 04/28/2017   CL 103 04/28/2017   CREATININE 1.45 04/28/2017   BUN 17 04/28/2017   CO2 28 04/28/2017   TSH 1.07 01/13/2017   PSA 1.03 01/13/2017   INR 1.13 04/06/2009   HGBA1C 7.3 (H) 04/28/2017    No results found.  Assessment & Plan:   There are no diagnoses linked to this encounter. I have discontinued Robert Lab Sr.'s cefdinir. I am also having him maintain his aspirin-acetaminophen-caffeine, lansoprazole, hydrocortisone-pramoxine, cholecalciferol, Ferrous Sulfate (IRON SUPPLEMENT PO), multivitamin, ciclopirox, fluticasone, levocetirizine, ONETOUCH ULTRALINK, glucose blood, ONE TOUCH LANCETS, amLODipine, telmisartan-hydrochlorothiazide, tadalafil, omeprazole, Dapagliflozin-Metformin HCl ER, and loratadine.  No  orders of the defined types were placed in this encounter.    Follow-up: No Follow-up on file.  Walker Kehr, MD

## 2017-07-26 NOTE — Assessment & Plan Note (Signed)
On B12 

## 2017-07-26 NOTE — Assessment & Plan Note (Signed)
Norvasc Micardis

## 2017-07-26 NOTE — Assessment & Plan Note (Signed)
Prevacid  

## 2017-07-26 NOTE — Assessment & Plan Note (Addendum)
Flonase Claritin Add Singulair ENT ref

## 2017-07-26 NOTE — Assessment & Plan Note (Signed)
Better w/Vit pack

## 2017-08-04 ENCOUNTER — Other Ambulatory Visit: Payer: Self-pay | Admitting: Internal Medicine

## 2017-08-08 MED ORDER — TADALAFIL 20 MG PO TABS: 20.0000 mg | ORAL_TABLET | Freq: Every day | ORAL | 5 refills | Status: DC | PRN

## 2017-08-09 MED ORDER — TADALAFIL 20 MG PO TABS: 20.0000 mg | ORAL_TABLET | Freq: Every day | ORAL | 11 refills | Status: DC | PRN

## 2017-08-10 ENCOUNTER — Telehealth: Payer: Self-pay

## 2017-08-10 NOTE — Telephone Encounter (Signed)
Rec'd fax from [facility], Tadalafil not covered by insurance suggested alternatives are Sildenafil 100mg  and Vardenafil 20mg   Please advise

## 2017-08-12 MED ORDER — VARDENAFIL HCL 20 MG PO TABS: 20.0000 mg | ORAL_TABLET | Freq: Every day | ORAL | 6 refills | Status: DC | PRN

## 2017-08-12 NOTE — Telephone Encounter (Signed)
Ok vardenafil 20 Thx

## 2017-08-25 ENCOUNTER — Other Ambulatory Visit: Payer: Self-pay

## 2017-08-25 MED ORDER — VARDENAFIL HCL 20 MG PO TABS: 20.0000 mg | ORAL_TABLET | Freq: Every day | ORAL | 6 refills | Status: DC | PRN

## 2017-08-28 ENCOUNTER — Other Ambulatory Visit: Payer: Self-pay | Admitting: Internal Medicine

## 2017-09-19 ENCOUNTER — Other Ambulatory Visit: Payer: Self-pay | Admitting: Internal Medicine

## 2017-10-02 ENCOUNTER — Encounter (INDEPENDENT_AMBULATORY_CARE_PROVIDER_SITE_OTHER): Payer: Self-pay | Admitting: Orthopedic Surgery

## 2017-10-02 ENCOUNTER — Ambulatory Visit (INDEPENDENT_AMBULATORY_CARE_PROVIDER_SITE_OTHER): Payer: BC Managed Care – PPO

## 2017-10-02 ENCOUNTER — Ambulatory Visit (INDEPENDENT_AMBULATORY_CARE_PROVIDER_SITE_OTHER): Payer: BC Managed Care – PPO | Admitting: Orthopedic Surgery

## 2017-10-02 DIAGNOSIS — M25561 Pain in right knee: Secondary | ICD-10-CM | POA: Diagnosis not present

## 2017-10-02 DIAGNOSIS — M25562 Pain in left knee: Secondary | ICD-10-CM | POA: Diagnosis not present

## 2017-10-02 DIAGNOSIS — M7541 Impingement syndrome of right shoulder: Secondary | ICD-10-CM | POA: Diagnosis not present
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
## 2017-10-24 ENCOUNTER — Ambulatory Visit: Payer: BC Managed Care – PPO | Admitting: Internal Medicine

## 2017-10-24 ENCOUNTER — Other Ambulatory Visit (INDEPENDENT_AMBULATORY_CARE_PROVIDER_SITE_OTHER): Payer: BC Managed Care – PPO

## 2017-10-24 ENCOUNTER — Encounter: Payer: Self-pay | Admitting: Internal Medicine

## 2017-10-24 DIAGNOSIS — E1165 Type 2 diabetes mellitus with hyperglycemia: Secondary | ICD-10-CM

## 2017-10-24 DIAGNOSIS — E559 Vitamin D deficiency, unspecified: Secondary | ICD-10-CM

## 2017-10-24 DIAGNOSIS — G8929 Other chronic pain: Secondary | ICD-10-CM | POA: Diagnosis not present

## 2017-10-24 DIAGNOSIS — E538 Deficiency of other specified B group vitamins: Secondary | ICD-10-CM

## 2017-10-24 DIAGNOSIS — M545 Low back pain: Secondary | ICD-10-CM

## 2017-10-24 DIAGNOSIS — I10 Essential (primary) hypertension: Secondary | ICD-10-CM

## 2017-10-24 LAB — BASIC METABOLIC PANEL WITH GFR
BUN: 14 mg/dL (ref 6–23)
CO2: 27 meq/L (ref 19–32)
Calcium: 10 mg/dL (ref 8.4–10.5)
Chloride: 101 meq/L (ref 96–112)
Creatinine, Ser: 1.42 mg/dL (ref 0.40–1.50)
GFR: 70.05 mL/min
Glucose, Bld: 195 mg/dL — ABNORMAL HIGH (ref 70–99)
Potassium: 3.8 meq/L (ref 3.5–5.1)
Sodium: 139 meq/L (ref 135–145)

## 2017-10-24 LAB — HEMOGLOBIN A1C: Hgb A1c MFr Bld: 8.2 % — ABNORMAL HIGH (ref 4.6–6.5)

## 2017-10-24 NOTE — Assessment & Plan Note (Signed)
Vit D 

## 2017-10-24 NOTE — Assessment & Plan Note (Signed)
MSK  

## 2017-10-24 NOTE — Assessment & Plan Note (Signed)
Dapagliflozin-Metformin

## 2017-10-24 NOTE — Progress Notes (Signed)
Subjective:  Patient ID: Robert Lab Sr., male    DOB: 01-22-1976  Age: 42 y.o. MRN: [id number]  CC: No chief complaint on file.   HPI Robert Lab Sr. presents for DM, OA, HTN f/u  Outpatient Medications Prior to Visit  Medication Sig Dispense Refill  . amLODipine (NORVASC) 10 MG tablet TAKE 1 TABLET (10 MG TOTAL) BY MOUTH DAILY. 90 tablet 3  . aspirin-acetaminophen-caffeine (EXCEDRIN MIGRAINE) 379-024-09 MG per tablet Take 2 tablets by mouth as needed.      . Blood Glucose Monitoring Suppl (ONETOUCH ULTRALINK) w/Device KIT As directed 1 each 3  . ciclopirox (PENLAC) 8 % solution Apply topically at bedtime. Apply over nail and surrounding skin. Apply daily over previous coat. After seven (7) days, may remove with alcohol and continue cycle. 6.6 mL 0  . Dapagliflozin-Metformin HCl ER (XIGDUO XR) 10-998 MG TB24 Take 1 tablet daily by mouth. 90 tablet 3  . Ferrous Sulfate (IRON SUPPLEMENT PO) Take 1 each by mouth daily.    . fluticasone (FLONASE) 50 MCG/ACT nasal spray Place 2 sprays into both nostrils daily. 16 g 6  . glucose blood test strip Use as instructed 50 each 12  . hydrocortisone-pramoxine (PROCTOFOAM-HC) rectal foam Place 1 applicator rectally 2 (two) times daily. As needed.     . lansoprazole (PREVACID) 30 MG capsule Take 30 mg by mouth as needed.      Marland Kitchen levocetirizine (XYZAL) 5 MG tablet TAKE 1 TABLET (5 MG TOTAL) BY MOUTH EVERY EVENING. 90 tablet 2  . loratadine (CLARITIN) 10 MG tablet Take 1 tablet (10 mg total) daily by mouth. 100 tablet 3  . montelukast (SINGULAIR) 10 MG tablet Take 1 tablet (10 mg total) by mouth daily. 30 tablet 11  . multivitamin (ONE-A-DAY MEN'S) TABS tablet Take 1 tablet by mouth daily.    Marland Kitchen omeprazole (PRILOSEC) 40 MG capsule Take 1 capsule (40 mg total) daily by mouth. 90 capsule 3  . ONE TOUCH LANCETS MISC As directed 100 each 3  . tadalafil (CIALIS) 20 MG tablet TAKE 1 TABLET BY MOUTH EVERY DAY AS NEEDED 30 tablet 3  .  telmisartan-hydrochlorothiazide (MICARDIS HCT) 80-25 MG tablet TAKE 1 TABLET BY MOUTH DAILY. 90 tablet 3  . vardenafil (LEVITRA) 20 MG tablet Take 1 tablet (20 mg total) by mouth daily as needed for erectile dysfunction. 20 tablet 6  . cholecalciferol (VITAMIN D) 1000 UNITS tablet Take 1 tablet (1,000 Units total) by mouth every other day. 200 tablet 3   No facility-administered medications prior to visit.     ROS Review of Systems  Constitutional: Negative for appetite change, fatigue and unexpected weight change.  HENT: Negative for congestion, nosebleeds, sneezing, sore throat and trouble swallowing.   Eyes: Negative for itching and visual disturbance.  Respiratory: Negative for cough.   Cardiovascular: Negative for chest pain, palpitations and leg swelling.  Gastrointestinal: Negative for abdominal distention, blood in stool, diarrhea and nausea.  Genitourinary: Negative for frequency and hematuria.  Musculoskeletal: Positive for arthralgias. Negative for back pain, gait problem, joint swelling and neck pain.  Skin: Negative for rash.  Neurological: Negative for dizziness, tremors, speech difficulty and weakness.  Psychiatric/Behavioral: Negative for agitation, dysphoric mood and sleep disturbance. The patient is not nervous/anxious.     Objective:  BP 126/82 (BP Location: Left Arm, Patient Position: Sitting, Cuff Size: Large)   Pulse 78   Temp 98 F (36.7 C) (Oral)   Ht '6\' 4"'$  (1.93 m)   Wt 292 lb (132.5 kg)  SpO2 98%   BMI 35.54 kg/m   BP Readings from Last 3 Encounters:  10/24/17 126/82  07/26/17 128/82  04/28/17 124/76    Wt Readings from Last 3 Encounters:  10/24/17 292 lb (132.5 kg)  07/26/17 293 lb (132.9 kg)  04/28/17 292 lb (132.5 kg)    Physical Exam  Constitutional: He is oriented to person, place, and time. He appears well-developed. No distress.  NAD  HENT:  Mouth/Throat: Oropharynx is clear and moist.  Eyes: Pupils are equal, round, and reactive to  light. Conjunctivae are normal.  Neck: Normal range of motion. No JVD present. No thyromegaly present.  Cardiovascular: Normal rate, regular rhythm, normal heart sounds and intact distal pulses. Exam reveals no gallop and no friction rub.  No murmur heard. Pulmonary/Chest: Effort normal and breath sounds normal. No respiratory distress. He has no wheezes. He has no rales. He exhibits no tenderness.  Abdominal: Soft. Bowel sounds are normal. He exhibits no distension and no mass. There is no tenderness. There is no rebound and no guarding.  Musculoskeletal: Normal range of motion. He exhibits no edema or tenderness.  Lymphadenopathy:    He has no cervical adenopathy.  Neurological: He is alert and oriented to person, place, and time. He has normal reflexes. No cranial nerve deficit. He exhibits normal muscle tone. He displays a negative Romberg sign. Coordination and gait normal.  Skin: Skin is warm and dry. No rash noted.  Psychiatric: He has a normal mood and affect. His behavior is normal. Judgment and thought content normal.   Knees w/scars  Lab Results  Component Value Date   WBC 6.7 01/13/2017   HGB 14.1 01/13/2017   HCT 42.8 01/13/2017   PLT 232.0 01/13/2017   GLUCOSE 186 (H) 07/26/2017   CHOL 159 01/13/2017   TRIG 165.0 (H) 01/13/2017   HDL 45.90 01/13/2017   LDLCALC 80 01/13/2017   ALT 39 04/28/2017   AST 25 04/28/2017   NA 140 07/26/2017   K 4.0 07/26/2017   CL 102 07/26/2017   CREATININE 1.55 (H) 07/26/2017   BUN 17 07/26/2017   CO2 28 07/26/2017   TSH 1.07 01/13/2017   PSA 1.03 01/13/2017   INR 1.13 04/06/2009   HGBA1C 7.5 (H) 07/26/2017    No results found.  Assessment & Plan:   There are no diagnoses linked to this encounter. I am having Robert Lab Sr. maintain his aspirin-acetaminophen-caffeine, lansoprazole, hydrocortisone-pramoxine, cholecalciferol, Ferrous Sulfate (IRON SUPPLEMENT PO), multivitamin, ciclopirox, fluticasone, levocetirizine, ONETOUCH  ULTRALINK, glucose blood, ONE TOUCH LANCETS, amLODipine, telmisartan-hydrochlorothiazide, omeprazole, Dapagliflozin-metFORMIN HCl ER, loratadine, montelukast, vardenafil, and tadalafil.  No orders of the defined types were placed in this encounter.    Follow-up: No follow-ups on file.  Walker Kehr, MD

## 2017-10-24 NOTE — Assessment & Plan Note (Signed)
Taking [employer]

## 2017-10-24 NOTE — Assessment & Plan Note (Signed)
Micardis, Norvasc

## 2017-11-23 ENCOUNTER — Ambulatory Visit (INDEPENDENT_AMBULATORY_CARE_PROVIDER_SITE_OTHER): Payer: BC Managed Care – PPO | Admitting: Orthopedic Surgery

## 2017-12-05 ENCOUNTER — Encounter: Payer: Self-pay | Admitting: Internal Medicine

## 2017-12-12 ENCOUNTER — Other Ambulatory Visit: Payer: Self-pay | Admitting: Internal Medicine

## 2017-12-12 MED ORDER — ARMODAFINIL 150 MG PO TABS: 150.0000 mg | ORAL_TABLET | Freq: Every day | ORAL | 5 refills | Status: DC

## 2017-12-13 ENCOUNTER — Telehealth: Payer: Self-pay | Admitting: *Deleted

## 2017-12-13 NOTE — Telephone Encounter (Signed)
Nuvigil PA initiated  Key: [id number]

## 2017-12-15 NOTE — Telephone Encounter (Signed)
Rec fax from [facility] Nuvigil 150 mg PA is approved 12/13/17 through 12/14/2018.

## 2017-12-20 LAB — HM DIABETES EYE EXAM

## 2018-01-03 ENCOUNTER — Encounter: Payer: Self-pay | Admitting: Internal Medicine

## 2018-02-14 ENCOUNTER — Other Ambulatory Visit: Payer: Self-pay | Admitting: Internal Medicine

## 2018-03-02 ENCOUNTER — Encounter: Payer: Self-pay | Admitting: Internal Medicine

## 2018-03-02 ENCOUNTER — Other Ambulatory Visit (INDEPENDENT_AMBULATORY_CARE_PROVIDER_SITE_OTHER): Payer: BC Managed Care – PPO

## 2018-03-02 ENCOUNTER — Ambulatory Visit: Payer: BC Managed Care – PPO | Admitting: Internal Medicine

## 2018-03-02 VITALS — BP 138/72 | HR 86 | Temp 98.2°F | Ht 76.0 in | Wt 290.0 lb

## 2018-03-02 DIAGNOSIS — E118 Type 2 diabetes mellitus with unspecified complications: Secondary | ICD-10-CM | POA: Diagnosis not present

## 2018-03-02 DIAGNOSIS — Z0000 Encounter for general adult medical examination without abnormal findings: Secondary | ICD-10-CM | POA: Diagnosis not present

## 2018-03-02 DIAGNOSIS — E291 Testicular hypofunction: Secondary | ICD-10-CM

## 2018-03-02 DIAGNOSIS — I10 Essential (primary) hypertension: Secondary | ICD-10-CM

## 2018-03-02 DIAGNOSIS — E538 Deficiency of other specified B group vitamins: Secondary | ICD-10-CM

## 2018-03-02 DIAGNOSIS — Z23 Encounter for immunization: Secondary | ICD-10-CM

## 2018-03-02 DIAGNOSIS — E1165 Type 2 diabetes mellitus with hyperglycemia: Secondary | ICD-10-CM

## 2018-03-02 DIAGNOSIS — E559 Vitamin D deficiency, unspecified: Secondary | ICD-10-CM | POA: Diagnosis not present

## 2018-03-02 DIAGNOSIS — R5383 Other fatigue: Secondary | ICD-10-CM

## 2018-03-02 DIAGNOSIS — IMO0002 Reserved for concepts with insufficient information to code with codable children: Secondary | ICD-10-CM

## 2018-03-02 LAB — CBC WITH DIFFERENTIAL/PLATELET
Basophils Absolute: 0 10*3/uL (ref 0.0–0.1)
Basophils Relative: 0.7 % (ref 0.0–3.0)
Eosinophils Absolute: 0.1 10*3/uL (ref 0.0–0.7)
Eosinophils Relative: 2.1 % (ref 0.0–5.0)
HCT: 43.9 % (ref 39.0–52.0)
Hemoglobin: 14.8 g/dL (ref 13.0–17.0)
Lymphocytes Relative: 34.3 % (ref 12.0–46.0)
Lymphs Abs: 2.2 10*3/uL (ref 0.7–4.0)
MCHC: 33.8 g/dL (ref 30.0–36.0)
MCV: 85.9 fl (ref 78.0–100.0)
Monocytes Absolute: 0.4 10*3/uL (ref 0.1–1.0)
Monocytes Relative: 5.6 % (ref 3.0–12.0)
Neutro Abs: 3.7 10*3/uL (ref 1.4–7.7)
Neutrophils Relative %: 57.3 % (ref 43.0–77.0)
Platelets: 281 10*3/uL (ref 150.0–400.0)
RBC: 5.11 Mil/uL (ref 4.22–5.81)
RDW: 13.7 % (ref 11.5–15.5)
WBC: 6.5 10*3/uL (ref 4.0–10.5)

## 2018-03-02 LAB — HEPATIC FUNCTION PANEL
ALK PHOS: 59 U/L (ref 39–117)
ALT: 32 U/L (ref 0–53)
AST: 18 U/L (ref 0–37)
Albumin: 4.8 g/dL (ref 3.5–5.2)
Bilirubin, Direct: 0.1 mg/dL (ref 0.0–0.3)
TOTAL PROTEIN: 7.8 g/dL (ref 6.0–8.3)
Total Bilirubin: 0.5 mg/dL (ref 0.2–1.2)

## 2018-03-02 LAB — LIPID PANEL
Cholesterol: 159 mg/dL (ref 0–200)
HDL: 50.1 mg/dL (ref >39.00)
LDL Cholesterol: 89 mg/dL (ref 0–99)
NonHDL: 109.23
Total CHOL/HDL Ratio: 3
Triglycerides: 103 mg/dL (ref 0.0–149.0)
VLDL: 20.6 mg/dL (ref 0.0–40.0)

## 2018-03-02 LAB — URINALYSIS
Bilirubin Urine: NEGATIVE
Hgb urine dipstick: NEGATIVE
Ketones, ur: NEGATIVE
Leukocytes, UA: NEGATIVE
Nitrite: NEGATIVE
Specific Gravity, Urine: 1.02 (ref 1.000–1.030)
Total Protein, Urine: NEGATIVE
Urine Glucose: >=1000 — AB
Urobilinogen, UA: 0.2 (ref 0.0–1.0)
pH: 5.5 (ref 5.0–8.0)

## 2018-03-02 LAB — BASIC METABOLIC PANEL
BUN: 19 mg/dL (ref 6–23)
CALCIUM: 9.9 mg/dL (ref 8.4–10.5)
CO2: 30 mEq/L (ref 19–32)
CREATININE: 1.41 mg/dL (ref 0.40–1.50)
Chloride: 103 mEq/L (ref 96–112)
GFR: 70.51 mL/min (ref >60.00)
Glucose, Bld: 157 mg/dL — ABNORMAL HIGH (ref 70–99)
Potassium: 3.8 mEq/L (ref 3.5–5.1)
Sodium: 140 mEq/L (ref 135–145)

## 2018-03-02 LAB — HEMOGLOBIN A1C: Hgb A1c MFr Bld: 8.5 % — ABNORMAL HIGH (ref 4.6–6.5)

## 2018-03-02 LAB — TESTOSTERONE: Testosterone: 248.11 ng/dL — ABNORMAL LOW (ref 300.00–890.00)

## 2018-03-02 MED ORDER — MONTELUKAST SODIUM 10 MG PO TABS: 10.0000 mg | ORAL_TABLET | Freq: Every day | ORAL | 3 refills | Status: DC

## 2018-03-02 MED ORDER — LEVOCETIRIZINE DIHYDROCHLORIDE 5 MG PO TABS: 5.0000 mg | ORAL_TABLET | Freq: Every evening | ORAL | 3 refills | Status: DC

## 2018-03-02 MED ORDER — TELMISARTAN-HCTZ 80-25 MG PO TABS: 1.0000 | ORAL_TABLET | Freq: Every day | ORAL | 3 refills | Status: DC

## 2018-03-02 MED ORDER — ARMODAFINIL 150 MG PO TABS: 150.0000 mg | ORAL_TABLET | Freq: Every day | ORAL | 1 refills | Status: DC

## 2018-03-02 MED ORDER — DAPAGLIFLOZIN PRO-METFORMIN ER 5-1000 MG PO TB24: 1.0000 | ORAL_TABLET | Freq: Every day | ORAL | 3 refills | Status: DC

## 2018-03-02 MED ORDER — LANSOPRAZOLE 30 MG PO CPDR: 30.0000 mg | DELAYED_RELEASE_CAPSULE | ORAL | 3 refills | Status: DC | PRN

## 2018-03-02 MED ORDER — AMLODIPINE BESYLATE 10 MG PO TABS: 10.0000 mg | ORAL_TABLET | Freq: Every day | ORAL | 3 refills | Status: DC

## 2018-03-02 MED ORDER — TADALAFIL 20 MG PO TABS: 20.0000 mg | ORAL_TABLET | Freq: Every day | ORAL | 3 refills | Status: DC | PRN

## 2018-03-02 NOTE — Addendum Note (Signed)
Addended by: Karren Cobble on: 03/02/2018 04:54 PM   Modules accepted: Orders

## 2018-03-02 NOTE — Assessment & Plan Note (Signed)
Resolved on Nuvigyl

## 2018-03-02 NOTE — Progress Notes (Signed)
Subjective:  Patient ID: Robert Lab Sr., male    DOB: February 16, 1976  Age: 42 y.o. MRN: [id number]  CC: No chief complaint on file.   HPI Robert Stillings Sr. presents for a well exam C/o L knee pain - Dr Teresa Coombs helped w/fatigue  Outpatient Medications Prior to Visit  Medication Sig Dispense Refill  . amLODipine (NORVASC) 10 MG tablet TAKE 1 TABLET (10 MG TOTAL) BY MOUTH DAILY. 90 tablet 3  . Armodafinil (NUVIGIL) 150 MG tablet Take 1 tablet (150 mg total) by mouth daily. Take in am. 30 tablet 5  . aspirin-acetaminophen-caffeine (EXCEDRIN MIGRAINE) 093-267-12 MG per tablet Take 2 tablets by mouth as needed.      . Blood Glucose Monitoring Suppl (ONETOUCH ULTRALINK) w/Device KIT As directed 1 each 3  . ciclopirox (PENLAC) 8 % solution Apply topically at bedtime. Apply over nail and surrounding skin. Apply daily over previous coat. After seven (7) days, may remove with alcohol and continue cycle. 6.6 mL 0  . Dapagliflozin-Metformin HCl ER (XIGDUO XR) 10-998 MG TB24 Take 1 tablet daily by mouth. 90 tablet 3  . Ferrous Sulfate (IRON SUPPLEMENT PO) Take 1 each by mouth daily.    . fluticasone (FLONASE) 50 MCG/ACT nasal spray Place 2 sprays into both nostrils daily. 16 g 6  . glucose blood test strip Use as instructed 50 each 12  . hydrocortisone-pramoxine (PROCTOFOAM-HC) rectal foam Place 1 applicator rectally 2 (two) times daily. As needed.     . lansoprazole (PREVACID) 30 MG capsule Take 30 mg by mouth as needed.      Marland Kitchen levocetirizine (XYZAL) 5 MG tablet TAKE 1 TABLET (5 MG TOTAL) BY MOUTH EVERY EVENING. 90 tablet 2  . loratadine (CLARITIN) 10 MG tablet Take 1 tablet (10 mg total) daily by mouth. 100 tablet 3  . montelukast (SINGULAIR) 10 MG tablet Take 1 tablet (10 mg total) by mouth daily. 30 tablet 11  . multivitamin (ONE-A-DAY MEN'S) TABS tablet Take 1 tablet by mouth daily.    Marland Kitchen omeprazole (PRILOSEC) 40 MG capsule Take 1 capsule (40 mg total) daily by mouth. 90 capsule 3  . ONE  TOUCH LANCETS MISC As directed 100 each 3  . tadalafil (CIALIS) 20 MG tablet TAKE 1 TABLET BY MOUTH EVERY DAY AS NEEDED 30 tablet 3  . telmisartan-hydrochlorothiazide (MICARDIS HCT) 80-25 MG tablet TAKE 1 TABLET BY MOUTH DAILY. 90 tablet 3  . vardenafil (LEVITRA) 20 MG tablet Take 1 tablet (20 mg total) by mouth daily as needed for erectile dysfunction. 20 tablet 6  . XIGDUO XR 10-998 MG TB24 TAKE 1 TABLET BY MOUTH EVERY DAY 30 tablet 11  . cholecalciferol (VITAMIN D) 1000 UNITS tablet Take 1 tablet (1,000 Units total) by mouth every other day. 200 tablet 3   No facility-administered medications prior to visit.     ROS: Review of Systems  Constitutional: Negative for appetite change, fatigue and unexpected weight change.  HENT: Negative for congestion, nosebleeds, sneezing, sore throat and trouble swallowing.   Eyes: Negative for itching and visual disturbance.  Respiratory: Negative for cough.   Cardiovascular: Negative for chest pain, palpitations and leg swelling.  Gastrointestinal: Negative for abdominal distention, blood in stool, diarrhea and nausea.  Genitourinary: Negative for frequency and hematuria.  Musculoskeletal: Positive for arthralgias. Negative for back pain, gait problem, joint swelling and neck pain.  Skin: Negative for rash.  Neurological: Negative for dizziness, tremors, speech difficulty and weakness.  Psychiatric/Behavioral: Negative for agitation, dysphoric mood and sleep disturbance. The  patient is not nervous/anxious.     Objective:  BP 138/72 (BP Location: Right Arm, Patient Position: Sitting, Cuff Size: Large)   Pulse 86   Temp 98.2 F (36.8 C) (Oral)   Ht 6' 4" (1.93 m)   Wt 290 lb (131.5 kg)   SpO2 96%   BMI 35.30 kg/m   BP Readings from Last 3 Encounters:  03/02/18 138/72  10/24/17 126/82  07/26/17 128/82    Wt Readings from Last 3 Encounters:  03/02/18 290 lb (131.5 kg)  10/24/17 292 lb (132.5 kg)  07/26/17 293 lb (132.9 kg)    Physical  Exam  Constitutional: He is oriented to person, place, and time. He appears well-developed. No distress.  NAD  HENT:  Mouth/Throat: Oropharynx is clear and moist.  Eyes: Pupils are equal, round, and reactive to light. Conjunctivae are normal.  Neck: Normal range of motion. No JVD present. No thyromegaly present.  Cardiovascular: Normal rate, regular rhythm, normal heart sounds and intact distal pulses. Exam reveals no gallop and no friction rub.  No murmur heard. Pulmonary/Chest: Effort normal and breath sounds normal. No respiratory distress. He has no wheezes. He has no rales. He exhibits no tenderness.  Abdominal: Soft. Bowel sounds are normal. He exhibits no distension and no mass. There is no tenderness. There is no rebound and no guarding.  Musculoskeletal: Normal range of motion. He exhibits tenderness. He exhibits no edema.  Lymphadenopathy:    He has no cervical adenopathy.  Neurological: He is alert and oriented to person, place, and time. He has normal reflexes. No cranial nerve deficit. He exhibits normal muscle tone. He displays a negative Romberg sign. Coordination and gait normal.  Skin: Skin is warm and dry. No rash noted.  Psychiatric: He has a normal mood and affect. His behavior is normal. Judgment and thought content normal.  L knee tender Pt refused rectal exam obese  Lab Results  Component Value Date   WBC 6.7 01/13/2017   HGB 14.1 01/13/2017   HCT 42.8 01/13/2017   PLT 232.0 01/13/2017   GLUCOSE 195 (H) 10/24/2017   CHOL 159 01/13/2017   TRIG 165.0 (H) 01/13/2017   HDL 45.90 01/13/2017   LDLCALC 80 01/13/2017   ALT 39 04/28/2017   AST 25 04/28/2017   NA 139 10/24/2017   K 3.8 10/24/2017   CL 101 10/24/2017   CREATININE 1.42 10/24/2017   BUN 14 10/24/2017   CO2 27 10/24/2017   TSH 1.07 01/13/2017   PSA 1.03 01/13/2017   INR 1.13 04/06/2009   HGBA1C 8.2 (H) 10/24/2017    No results found.  Assessment & Plan:   There are no diagnoses linked to  this encounter.   No orders of the defined types were placed in this encounter.    Follow-up: No follow-ups on file.  Walker Kehr, MD

## 2018-03-02 NOTE — Assessment & Plan Note (Signed)
Vit B12 

## 2018-03-02 NOTE — Assessment & Plan Note (Signed)
Vit D 

## 2018-03-02 NOTE — Assessment & Plan Note (Signed)
We discussed age appropriate health related issues, including available/recomended screening tests and vaccinations. We discussed a need for adhering to healthy diet and exercise. Labs ordered. All questions were answered. 

## 2018-03-02 NOTE — Assessment & Plan Note (Signed)
Labs

## 2018-03-02 NOTE — Assessment & Plan Note (Signed)
Losartan  Norvasc

## 2018-03-05 ENCOUNTER — Other Ambulatory Visit: Payer: Self-pay | Admitting: Internal Medicine

## 2018-03-05 LAB — TSH: TSH: 1.08 u[IU]/mL (ref 0.35–4.50)

## 2018-03-05 LAB — VITAMIN D 25 HYDROXY (VIT D DEFICIENCY, FRACTURES): VITD: 25.67 ng/mL — ABNORMAL LOW (ref 30.00–100.00)

## 2018-03-05 LAB — VITAMIN B12: Vitamin B-12: 863 pg/mL (ref 211–911)

## 2018-03-05 LAB — PSA: PSA: 0.78 ng/mL (ref 0.10–4.00)

## 2018-03-05 MED ORDER — VITAMIN D3 50 MCG (2000 UT) PO CAPS: 2000.0000 [IU] | ORAL_CAPSULE | Freq: Every day | ORAL | 3 refills | Status: DC

## 2018-03-05 MED ORDER — DAPAGLIFLOZIN PRO-METFORMIN ER 5-1000 MG PO TB24: 1.0000 | ORAL_TABLET | Freq: Two times a day (BID) | ORAL | 3 refills | Status: DC

## 2018-03-05 MED ORDER — VITAMIN D3 1.25 MG (50000 UT) PO CAPS: 1.0000 | ORAL_CAPSULE | ORAL | 0 refills | Status: DC

## 2018-03-16 ENCOUNTER — Other Ambulatory Visit: Payer: Self-pay | Admitting: Internal Medicine

## 2018-04-12 ENCOUNTER — Other Ambulatory Visit: Payer: Self-pay | Admitting: Internal Medicine

## 2018-04-13 MED ORDER — ARMODAFINIL 150 MG PO TABS: 150.0000 mg | ORAL_TABLET | Freq: Every day | ORAL | 1 refills | Status: DC

## 2018-04-21 ENCOUNTER — Other Ambulatory Visit: Payer: Self-pay | Admitting: Internal Medicine

## 2018-05-14 ENCOUNTER — Other Ambulatory Visit: Payer: Self-pay | Admitting: Internal Medicine

## 2018-05-22 ENCOUNTER — Other Ambulatory Visit: Payer: Self-pay | Admitting: Internal Medicine

## 2018-06-17 ENCOUNTER — Other Ambulatory Visit: Payer: Self-pay | Admitting: Internal Medicine

## 2018-06-26 ENCOUNTER — Other Ambulatory Visit: Payer: Self-pay | Admitting: Internal Medicine

## 2018-07-02 ENCOUNTER — Ambulatory Visit: Payer: BC Managed Care – PPO | Admitting: Internal Medicine

## 2018-07-10 ENCOUNTER — Other Ambulatory Visit: Payer: Self-pay | Admitting: Internal Medicine

## 2018-07-17 ENCOUNTER — Ambulatory Visit: Payer: BC Managed Care – PPO | Admitting: Internal Medicine

## 2018-07-30 ENCOUNTER — Encounter: Payer: Self-pay | Admitting: Internal Medicine

## 2018-07-30 ENCOUNTER — Ambulatory Visit: Payer: BC Managed Care – PPO | Admitting: Internal Medicine

## 2018-07-30 ENCOUNTER — Other Ambulatory Visit (INDEPENDENT_AMBULATORY_CARE_PROVIDER_SITE_OTHER): Payer: BC Managed Care – PPO

## 2018-07-30 DIAGNOSIS — I10 Essential (primary) hypertension: Secondary | ICD-10-CM

## 2018-07-30 DIAGNOSIS — Z0000 Encounter for general adult medical examination without abnormal findings: Secondary | ICD-10-CM | POA: Diagnosis not present

## 2018-07-30 DIAGNOSIS — E1165 Type 2 diabetes mellitus with hyperglycemia: Secondary | ICD-10-CM

## 2018-07-30 LAB — BASIC METABOLIC PANEL
BUN: 14 mg/dL (ref 6–23)
CO2: 27 meq/L (ref 19–32)
CREATININE: 1.27 mg/dL (ref 0.40–1.50)
Calcium: 9.7 mg/dL (ref 8.4–10.5)
Chloride: 104 mEq/L (ref 96–112)
GFR: 74.7 mL/min (ref >60.00)
Glucose, Bld: 114 mg/dL — ABNORMAL HIGH (ref 70–99)
POTASSIUM: 3.8 meq/L (ref 3.5–5.1)
Sodium: 141 mEq/L (ref 135–145)

## 2018-07-30 LAB — HEMOGLOBIN A1C: Hgb A1c MFr Bld: 7 % — ABNORMAL HIGH (ref 4.6–6.5)

## 2018-07-30 MED ORDER — ARMODAFINIL 250 MG PO TABS: 250.0000 mg | ORAL_TABLET | Freq: Every day | ORAL | 1 refills | Status: DC

## 2018-07-30 NOTE — Assessment & Plan Note (Addendum)
A cardiac CT scan for calcium scoring offered  Labs

## 2018-07-30 NOTE — Assessment & Plan Note (Signed)
A cardiac CT scan for calcium scoring offered Micardis

## 2018-07-30 NOTE — Progress Notes (Signed)
Subjective:  Patient ID: Robert Lab Sr., male    DOB: [birth year]  Age: 43 y.o. MRN: [id number]  CC: No chief complaint on file.   HPI Robert Lab Sr. presents for DM, somnolence, DM f/u C/o fatigue - Nuvigil is not helping as well  Outpatient Medications Prior to Visit  Medication Sig Dispense Refill  . amLODipine (NORVASC) 10 MG tablet Take 1 tablet (10 mg total) by mouth daily. 90 tablet 3  . amLODipine (NORVASC) 10 MG tablet TAKE 1 TABLET BY MOUTH EVERY DAY 90 tablet 3  . Armodafinil (NUVIGIL) 150 MG tablet Take 1 tablet (150 mg total) by mouth daily. Take in am. 90 tablet 1  . aspirin-acetaminophen-caffeine (EXCEDRIN MIGRAINE) 237-628-31 MG per tablet Take 2 tablets by mouth as needed.      . Blood Glucose Monitoring Suppl (ONETOUCH ULTRALINK) w/Device KIT As directed 1 each 3  . Cholecalciferol (VITAMIN D3) 1.25 MG (50000 UT) CAPS TAKE 1 CAPSULE BY MOUTH ONE TIME PER WEEK 4 capsule 1  . Cholecalciferol (VITAMIN D3) 2000 units capsule Take 1 capsule (2,000 Units total) by mouth daily. 100 capsule 3  . ciclopirox (PENLAC) 8 % solution Apply topically at bedtime. Apply over nail and surrounding skin. Apply daily over previous coat. After seven (7) days, may remove with alcohol and continue cycle. 6.6 mL 0  . Dapagliflozin-metFORMIN HCl ER (XIGDUO XR) 10-998 MG TB24 Take 1 tablet by mouth 2 (two) times daily. 180 tablet 3  . Ferrous Sulfate (IRON SUPPLEMENT PO) Take 1 each by mouth daily.    . fluticasone (FLONASE) 50 MCG/ACT nasal spray PLACE 2 SPRAY EACH NOSTRIL EVERY DAY 16 g 5  . glucose blood test strip Use as instructed 50 each 12  . hydrocortisone-pramoxine (PROCTOFOAM-HC) rectal foam Place 1 applicator rectally 2 (two) times daily. As needed.     . lansoprazole (PREVACID) 30 MG capsule Take 1 capsule (30 mg total) by mouth as needed. 90 capsule 3  . levocetirizine (XYZAL) 5 MG tablet Take 1 tablet (5 mg total) by mouth every evening. 90 tablet 3  . montelukast  (SINGULAIR) 10 MG tablet Take 1 tablet (10 mg total) by mouth daily. 90 tablet 3  . multivitamin (ONE-A-DAY MEN'S) TABS tablet Take 1 tablet by mouth daily.    Marland Kitchen omeprazole (PRILOSEC) 40 MG capsule TAKE 1 CAPSULE (40 MG TOTAL) DAILY BY MOUTH. 90 capsule 2  . ONE TOUCH LANCETS MISC As directed 100 each 3  . tadalafil (ADCIRCA/CIALIS) 20 MG tablet Take 1 tablet (20 mg total) by mouth daily as needed. 30 tablet 3  . telmisartan-hydrochlorothiazide (MICARDIS HCT) 80-25 MG tablet Take 1 tablet by mouth daily. 90 tablet 3  . telmisartan-hydrochlorothiazide (MICARDIS HCT) 80-25 MG tablet TAKE 1 TABLET BY MOUTH EVERY DAY 90 tablet 3  . vardenafil (LEVITRA) 20 MG tablet Take 1 tablet (20 mg total) by mouth daily as needed for erectile dysfunction. 20 tablet 6   No facility-administered medications prior to visit.     ROS: Review of Systems  Constitutional: Positive for fatigue. Negative for appetite change and unexpected weight change.  HENT: Negative for congestion, nosebleeds, sneezing, sore throat and trouble swallowing.   Eyes: Negative for itching and visual disturbance.  Respiratory: Negative for cough.   Cardiovascular: Negative for chest pain, palpitations and leg swelling.  Gastrointestinal: Negative for abdominal distention, blood in stool, diarrhea and nausea.  Genitourinary: Negative for frequency and hematuria.  Musculoskeletal: Negative for back pain, gait problem, joint swelling and neck pain.  Skin: Negative for rash.  Neurological: Negative for dizziness, tremors, speech difficulty and weakness.  Psychiatric/Behavioral: Positive for decreased concentration. Negative for agitation, dysphoric mood, sleep disturbance and suicidal ideas. The patient is not nervous/anxious.     Objective:  BP 120/76 (BP Location: Left Arm, Patient Position: Sitting, Cuff Size: Large)   Pulse 83   Temp 97.8 F (36.6 C) (Oral)   Ht '6\' 4"'$  (1.93 m)   Wt 292 lb (132.5 kg)   SpO2 95%   BMI 35.54 kg/m    BP Readings from Last 3 Encounters:  07/30/18 120/76  03/02/18 138/72  10/24/17 126/82    Wt Readings from Last 3 Encounters:  07/30/18 292 lb (132.5 kg)  03/02/18 290 lb (131.5 kg)  10/24/17 292 lb (132.5 kg)    Physical Exam Constitutional:      General: He is not in acute distress.    Appearance: He is well-developed.     Comments: NAD  Eyes:     Conjunctiva/sclera: Conjunctivae normal.     Pupils: Pupils are equal, round, and reactive to light.  Neck:     Musculoskeletal: Normal range of motion.     Thyroid: No thyromegaly.     Vascular: No JVD.  Cardiovascular:     Rate and Rhythm: Normal rate and regular rhythm.     Heart sounds: Normal heart sounds. No murmur. No friction rub. No gallop.   Pulmonary:     Effort: Pulmonary effort is normal. No respiratory distress.     Breath sounds: Normal breath sounds. No wheezing or rales.  Chest:     Chest wall: No tenderness.  Abdominal:     General: Bowel sounds are normal. There is no distension.     Palpations: Abdomen is soft. There is no mass.     Tenderness: There is no abdominal tenderness. There is no guarding or rebound.  Musculoskeletal: Normal range of motion.        General: No tenderness.  Lymphadenopathy:     Cervical: No cervical adenopathy.  Skin:    General: Skin is warm and dry.     Findings: No rash.  Neurological:     Mental Status: He is alert and oriented to person, place, and time.     Cranial Nerves: No cranial nerve deficit.     Motor: No abnormal muscle tone.     Coordination: Coordination normal.     Gait: Gait normal.     Deep Tendon Reflexes: Reflexes are normal and symmetric.  Psychiatric:        Behavior: Behavior normal.        Thought Content: Thought content normal.        Judgment: Judgment normal.     Lab Results  Component Value Date   WBC 6.5 03/02/2018   HGB 14.8 03/02/2018   HCT 43.9 03/02/2018   PLT 281.0 03/02/2018   GLUCOSE 157 (H) 03/02/2018   CHOL 159  03/02/2018   TRIG 103.0 03/02/2018   HDL 50.10 03/02/2018   LDLCALC 89 03/02/2018   ALT 32 03/02/2018   AST 18 03/02/2018   NA 140 03/02/2018   K 3.8 03/02/2018   CL 103 03/02/2018   CREATININE 1.41 03/02/2018   BUN 19 03/02/2018   CO2 30 03/02/2018   TSH 1.08 03/02/2018   PSA 0.78 03/02/2018   INR 1.13 04/06/2009   HGBA1C 8.5 (H) 03/02/2018    No results found.  Assessment & Plan:   There are no diagnoses linked to this  encounter.   No orders of the defined types were placed in this encounter.    Follow-up: No follow-ups on file.  Walker Kehr, MD

## 2018-07-30 NOTE — Patient Instructions (Signed)

## 2018-08-07 ENCOUNTER — Other Ambulatory Visit: Payer: Self-pay | Admitting: Internal Medicine

## 2018-08-16 ENCOUNTER — Other Ambulatory Visit: Payer: Self-pay

## 2018-08-16 MED ORDER — VITAMIN D3 1.25 MG (50000 UT) PO CAPS: ORAL_CAPSULE | ORAL | 1 refills | Status: DC

## 2018-09-29 ENCOUNTER — Other Ambulatory Visit: Payer: Self-pay | Admitting: Internal Medicine

## 2018-10-26 ENCOUNTER — Other Ambulatory Visit: Payer: Self-pay | Admitting: Internal Medicine

## 2018-11-28 ENCOUNTER — Ambulatory Visit (INDEPENDENT_AMBULATORY_CARE_PROVIDER_SITE_OTHER): Payer: BC Managed Care – PPO | Admitting: Internal Medicine

## 2018-11-28 ENCOUNTER — Encounter: Payer: Self-pay | Admitting: Internal Medicine

## 2018-11-28 DIAGNOSIS — E1165 Type 2 diabetes mellitus with hyperglycemia: Secondary | ICD-10-CM

## 2018-11-28 DIAGNOSIS — I10 Essential (primary) hypertension: Secondary | ICD-10-CM

## 2018-11-28 DIAGNOSIS — E559 Vitamin D deficiency, unspecified: Secondary | ICD-10-CM | POA: Diagnosis not present

## 2018-11-28 DIAGNOSIS — E538 Deficiency of other specified B group vitamins: Secondary | ICD-10-CM | POA: Diagnosis not present

## 2018-11-28 NOTE — Progress Notes (Signed)
Virtual Visit via Video Note  I connected with Robert Lab Sr. on 11/28/18 at  2:20 PM EDT by a video enabled telemedicine application and verified that I am speaking with the correct person using two identifiers.   I discussed the limitations of evaluation and management by telemedicine and the availability of in person appointments. The patient expressed understanding and agreed to proceed.  History of Present Illness: We need to follow-up on DM, HTN, sleep disorder  There has been no runny nose, cough, chest pain, shortness of breath, abdominal pain, diarrhea, constipation, arthralgias, skin rashes.   Observations/Objective: The patient appears to be in no acute distress, looks well. Wt 276 Assessment and Plan:  See my Assessment and Plan. Follow Up Instructions:    I discussed the assessment and treatment plan with the patient. The patient was provided an opportunity to ask questions and all were answered. The patient agreed with the plan and demonstrated an understanding of the instructions.   The patient was advised to call back or seek an in-person evaluation if the symptoms worsen or if the condition fails to improve as anticipated.  I provided face-to-face time during this encounter. We were at different locations.   Walker Kehr, MD

## 2018-11-28 NOTE — Assessment & Plan Note (Signed)
On vitamin D 

## 2018-11-28 NOTE — Assessment & Plan Note (Signed)
Micardis, amlodipine Labs

## 2018-11-28 NOTE — Assessment & Plan Note (Signed)
Doing much better per patient Xigduo XR Labs

## 2018-11-28 NOTE — Assessment & Plan Note (Signed)
On B12 

## 2018-11-30 ENCOUNTER — Other Ambulatory Visit (INDEPENDENT_AMBULATORY_CARE_PROVIDER_SITE_OTHER): Payer: BC Managed Care – PPO

## 2018-11-30 DIAGNOSIS — E1165 Type 2 diabetes mellitus with hyperglycemia: Secondary | ICD-10-CM

## 2018-11-30 LAB — BASIC METABOLIC PANEL
BUN: 16 mg/dL (ref 6–23)
CO2: 23 mEq/L (ref 19–32)
Calcium: 10 mg/dL (ref 8.4–10.5)
Chloride: 105 mEq/L (ref 96–112)
Creatinine, Ser: 1.28 mg/dL (ref 0.40–1.50)
GFR: 73.91 mL/min (ref >60.00)
Glucose, Bld: 135 mg/dL — ABNORMAL HIGH (ref 70–99)
Potassium: 3.9 mEq/L (ref 3.5–5.1)
Sodium: 141 mEq/L (ref 135–145)

## 2018-11-30 LAB — HEMOGLOBIN A1C: Hgb A1c MFr Bld: 6.9 % — ABNORMAL HIGH (ref 4.6–6.5)

## 2019-02-01 ENCOUNTER — Other Ambulatory Visit: Payer: Self-pay | Admitting: Internal Medicine

## 2019-02-01 NOTE — Telephone Encounter (Signed)
[place] Controlled Database Checked Last filled: 10/31/18 # 90 LOV w/you: 11/28/18 Next appt w/you: None

## 2019-02-08 ENCOUNTER — Telehealth: Payer: Self-pay | Admitting: *Deleted

## 2019-02-08 NOTE — Telephone Encounter (Signed)
Armodafinil 250 mg PA initiated via CoverMyMeds  Key: [id number]  PA Case ID: [id number] Pt ID: [id number]

## 2019-02-19 ENCOUNTER — Other Ambulatory Visit: Payer: Self-pay | Admitting: Internal Medicine

## 2019-03-13 ENCOUNTER — Other Ambulatory Visit: Payer: Self-pay | Admitting: Internal Medicine

## 2019-03-26 ENCOUNTER — Other Ambulatory Visit: Payer: Self-pay | Admitting: Internal Medicine

## 2019-04-08 ENCOUNTER — Other Ambulatory Visit: Payer: Self-pay | Admitting: Internal Medicine

## 2019-05-15 ENCOUNTER — Other Ambulatory Visit: Payer: Self-pay | Admitting: Internal Medicine

## 2019-07-09 ENCOUNTER — Other Ambulatory Visit: Payer: Self-pay | Admitting: Internal Medicine

## 2019-09-05 ENCOUNTER — Other Ambulatory Visit: Payer: Self-pay | Admitting: Internal Medicine

## 2019-11-19 ENCOUNTER — Ambulatory Visit: Payer: BC Managed Care – PPO | Admitting: Internal Medicine

## 2019-11-19 ENCOUNTER — Other Ambulatory Visit: Payer: Self-pay

## 2019-11-19 ENCOUNTER — Encounter: Payer: Self-pay | Admitting: Internal Medicine

## 2019-11-19 VITALS — BP 156/94 | HR 90 | Temp 98.4°F | Resp 16 | Ht 76.0 in | Wt 290.0 lb

## 2019-11-19 DIAGNOSIS — E559 Vitamin D deficiency, unspecified: Secondary | ICD-10-CM | POA: Diagnosis not present

## 2019-11-19 DIAGNOSIS — E538 Deficiency of other specified B group vitamins: Secondary | ICD-10-CM

## 2019-11-19 DIAGNOSIS — E118 Type 2 diabetes mellitus with unspecified complications: Secondary | ICD-10-CM

## 2019-11-19 DIAGNOSIS — I10 Essential (primary) hypertension: Secondary | ICD-10-CM | POA: Diagnosis not present

## 2019-11-19 DIAGNOSIS — E785 Hyperlipidemia, unspecified: Secondary | ICD-10-CM | POA: Diagnosis not present

## 2019-11-19 DIAGNOSIS — Z0000 Encounter for general adult medical examination without abnormal findings: Secondary | ICD-10-CM | POA: Diagnosis not present

## 2019-11-19 DIAGNOSIS — G4730 Sleep apnea, unspecified: Secondary | ICD-10-CM

## 2019-11-19 DIAGNOSIS — G4710 Hypersomnia, unspecified: Secondary | ICD-10-CM

## 2019-11-19 LAB — PSA: PSA: 0.75 ng/mL (ref 0.10–4.00)

## 2019-11-19 LAB — HEPATIC FUNCTION PANEL
ALT: 36 U/L (ref 0–53)
AST: 17 U/L (ref 0–37)
Albumin: 4.8 g/dL (ref 3.5–5.2)
Alkaline Phosphatase: 62 U/L (ref 39–117)
Bilirubin, Direct: 0.1 mg/dL (ref 0.0–0.3)
Total Bilirubin: 0.5 mg/dL (ref 0.2–1.2)
Total Protein: 7.7 g/dL (ref 6.0–8.3)

## 2019-11-19 LAB — BASIC METABOLIC PANEL
BUN: 20 mg/dL (ref 6–23)
CO2: 29 mEq/L (ref 19–32)
Calcium: 10.3 mg/dL (ref 8.4–10.5)
Chloride: 100 mEq/L (ref 96–112)
Creatinine, Ser: 1.32 mg/dL (ref 0.40–1.50)
GFR: 71.02 mL/min (ref >60.00)
Glucose, Bld: 157 mg/dL — ABNORMAL HIGH (ref 70–99)
Potassium: 3.8 mEq/L (ref 3.5–5.1)
Sodium: 138 mEq/L (ref 135–145)

## 2019-11-19 LAB — CBC WITH DIFFERENTIAL/PLATELET
Basophils Absolute: 0 10*3/uL (ref 0.0–0.1)
Basophils Relative: 0.4 % (ref 0.0–3.0)
Eosinophils Absolute: 0 10*3/uL (ref 0.0–0.7)
Eosinophils Relative: 0.6 % (ref 0.0–5.0)
HCT: 45.5 % (ref 39.0–52.0)
Hemoglobin: 15.4 g/dL (ref 13.0–17.0)
Lymphocytes Relative: 31.1 % (ref 12.0–46.0)
Lymphs Abs: 2 10*3/uL (ref 0.7–4.0)
MCHC: 33.9 g/dL (ref 30.0–36.0)
MCV: 88.6 fl (ref 78.0–100.0)
Monocytes Absolute: 0.3 10*3/uL (ref 0.1–1.0)
Monocytes Relative: 4.4 % (ref 3.0–12.0)
Neutro Abs: 4.1 10*3/uL (ref 1.4–7.7)
Neutrophils Relative %: 63.5 % (ref 43.0–77.0)
Platelets: 246 10*3/uL (ref 150.0–400.0)
RBC: 5.13 Mil/uL (ref 4.22–5.81)
RDW: 13.6 % (ref 11.5–15.5)
WBC: 6.4 10*3/uL (ref 4.0–10.5)

## 2019-11-19 LAB — LIPID PANEL
Cholesterol: 184 mg/dL (ref 0–200)
HDL: 54.4 mg/dL (ref >39.00)
LDL Cholesterol: 110 mg/dL — ABNORMAL HIGH (ref 0–99)
NonHDL: 129.38
Total CHOL/HDL Ratio: 3
Triglycerides: 95 mg/dL (ref 0.0–149.0)
VLDL: 19 mg/dL (ref 0.0–40.0)

## 2019-11-19 LAB — TSH: TSH: 0.98 u[IU]/mL (ref 0.35–4.50)

## 2019-11-19 LAB — MICROALBUMIN / CREATININE URINE RATIO
Creatinine,U: 68.8 mg/dL
Microalb Creat Ratio: 1.6 mg/g (ref 0.0–30.0)
Microalb, Ur: 1.1 mg/dL (ref 0.0–1.9)

## 2019-11-19 LAB — URINALYSIS, ROUTINE W REFLEX MICROSCOPIC
Bilirubin Urine: NEGATIVE
Hgb urine dipstick: NEGATIVE
Ketones, ur: NEGATIVE
Leukocytes,Ua: NEGATIVE
Nitrite: NEGATIVE
RBC / HPF: NONE SEEN (ref 0–?)
Specific Gravity, Urine: 1.02 (ref 1.000–1.030)
Total Protein, Urine: NEGATIVE
Urine Glucose: >=1000 — AB
Urobilinogen, UA: 0.2 (ref 0.0–1.0)
WBC, UA: NONE SEEN (ref 0–?)
pH: 5 (ref 5.0–8.0)

## 2019-11-19 LAB — POCT GLYCOSYLATED HEMOGLOBIN (HGB A1C): Hemoglobin A1C: 7.5 % — AB (ref 4.0–5.6)

## 2019-11-19 LAB — VITAMIN D 25 HYDROXY (VIT D DEFICIENCY, FRACTURES): VITD: 30.21 ng/mL (ref 30.00–100.00)

## 2019-11-19 LAB — FOLATE: Folate: >24.8 ng/mL (ref >5.9)

## 2019-11-19 LAB — VITAMIN B12: Vitamin B-12: 655 pg/mL (ref 211–911)

## 2019-11-19 MED ORDER — AMLODIPINE BESYLATE 10 MG PO TABS: 10.0000 mg | ORAL_TABLET | Freq: Every day | ORAL | 1 refills | Status: DC

## 2019-11-19 MED ORDER — ROSUVASTATIN CALCIUM 10 MG PO TABS: 10.0000 mg | ORAL_TABLET | Freq: Every day | ORAL | 1 refills | Status: DC

## 2019-11-19 MED ORDER — XIGDUO XR 10-1000 MG PO TB24: 1.0000 | ORAL_TABLET | Freq: Every day | ORAL | 1 refills | Status: DC

## 2019-11-19 MED ORDER — ARMODAFINIL 250 MG PO TABS: 250.0000 mg | ORAL_TABLET | Freq: Every day | ORAL | 1 refills | Status: DC

## 2019-11-19 MED ORDER — TELMISARTAN 40 MG PO TABS: 40.0000 mg | ORAL_TABLET | Freq: Every day | ORAL | 1 refills | Status: DC

## 2019-11-19 NOTE — Progress Notes (Signed)
Subjective:  Patient ID: Robert Lab Sr., male    DOB: [birth year]  Age: 44 y.o. MRN: [id number]  CC: Annual Exam, Diabetes, and Hyperlipidemia  This visit occurred during the SARS-CoV-2 public health emergency.  Safety protocols were in place, including screening questions prior to the visit, additional usage of staff PPE, and extensive cleaning of exam room while observing appropriate contact time as indicated for disinfecting solutions.   NEW TO ME  HPI Robert Carino Sr. presents for a CPX.  He was not very straightforward with his answers about whether or not he is compliant with his current meds.  He requests a refill on xigduo and armodafinil.  He complains of polys.  He is active and denies any recent episodes of chest pain, shortness of breath, palpitations, or edema.  He tells me he is compliant with his CPAP machine.  He continues to complain of daytime fatigue and somnolence.  Outpatient Medications Prior to Visit  Medication Sig Dispense Refill   Blood Glucose Monitoring Suppl (ONETOUCH ULTRALINK) w/Device KIT As directed 1 each 3   Cholecalciferol (VITAMIN D3) 1.25 MG (50000 UT) CAPS TAKE 1 CAPSULE BY MOUTH ONE TIME PER WEEK 4 capsule 1   Cholecalciferol (VITAMIN D3) 2000 units capsule Take 1 capsule (2,000 Units total) by mouth daily. 100 capsule 3   Ferrous Sulfate (IRON SUPPLEMENT PO) Take 1 each by mouth daily.     fluticasone (FLONASE) 50 MCG/ACT nasal spray PLACE 2 SPRAY EACH NOSTRIL EVERY DAY 16 g 5   glucose blood test strip Use as instructed 50 each 12   hydrocortisone-pramoxine (PROCTOFOAM-HC) rectal foam Place 1 applicator rectally 2 (two) times daily. As needed.      lansoprazole (PREVACID) 30 MG capsule Take 1 capsule (30 mg total) by mouth as needed. 90 capsule 3   levocetirizine (XYZAL) 5 MG tablet Take 1 tablet (5 mg total) by mouth every evening. 90 tablet 3   montelukast (SINGULAIR) 10 MG tablet TAKE 1 TABLET BY MOUTH EVERY DAY 90 tablet 3    multivitamin (ONE-A-DAY MEN'S) TABS tablet Take 1 tablet by mouth daily.     omeprazole (PRILOSEC) 40 MG capsule TAKE 1 CAPSULE BY MOUTH EVERY DAY 90 capsule 3   ONE TOUCH LANCETS MISC As directed 100 each 3   amLODipine (NORVASC) 10 MG tablet TAKE 1 TABLET BY MOUTH EVERY DAY 90 tablet 3   Armodafinil 250 MG tablet Take 1 tablet (250 mg total) by mouth daily. Office visit needed before refills will be given 90 tablet 2   aspirin-acetaminophen-caffeine (EXCEDRIN MIGRAINE) 250-250-65 MG per tablet Take 2 tablets by mouth as needed.       ciclopirox (PENLAC) 8 % solution Apply topically at bedtime. Apply over nail and surrounding skin. Apply daily over previous coat. After seven (7) days, may remove with alcohol and continue cycle. 6.6 mL 0   tadalafil (ADCIRCA/CIALIS) 20 MG tablet Take 1 tablet (20 mg total) by mouth daily as needed. 30 tablet 3   telmisartan-hydrochlorothiazide (MICARDIS HCT) 80-25 MG tablet TAKE 1 TABLET BY MOUTH EVERY DAY 90 tablet 3   XIGDUO XR 10-998 MG TB24 TAKE 1 TABLET BY MOUTH TWICE A DAY 180 tablet 3   vardenafil (LEVITRA) 20 MG tablet Take 1 tablet (20 mg total) by mouth daily as needed for erectile dysfunction. 20 tablet 6   No facility-administered medications prior to visit.    ROS Review of Systems  Constitutional: Positive for fatigue. Negative for chills, diaphoresis, fever and unexpected weight  change.  HENT: Negative.   Eyes: Negative for visual disturbance.  Respiratory: Positive for apnea. Negative for cough, chest tightness, shortness of breath and wheezing.   Cardiovascular: Negative for chest pain, palpitations and leg swelling.  Gastrointestinal: Negative for abdominal pain, constipation, diarrhea, nausea and vomiting.  Endocrine: Positive for polydipsia and polyuria.  Genitourinary: Negative.  Negative for difficulty urinating, dysuria, frequency, scrotal swelling, testicular pain and urgency.  Musculoskeletal: Negative.  Negative for  arthralgias and myalgias.  Skin: Negative.   Neurological: Negative.  Negative for dizziness, weakness, light-headedness, numbness and headaches.  Hematological: Negative for adenopathy. Does not bruise/bleed easily.  Psychiatric/Behavioral: Negative.     Objective:  BP (!) 156/94 (BP Location: Left Arm, Patient Position: Sitting, Cuff Size: Large)    Pulse 90    Temp 98.4 F (36.9 C) (Oral)    Resp 16    Ht 6' 4" (1.93 m)    Wt 290 lb (131.5 kg)    SpO2 97%    BMI 35.30 kg/m   BP Readings from Last 3 Encounters:  11/19/19 (!) 156/94  07/30/18 120/76  03/02/18 138/72    Wt Readings from Last 3 Encounters:  11/19/19 290 lb (131.5 kg)  07/30/18 292 lb (132.5 kg)  03/02/18 290 lb (131.5 kg)    Physical Exam Vitals reviewed.  Constitutional:      Appearance: He is obese.  HENT:     Nose: Nose normal.     Mouth/Throat:     Mouth: Mucous membranes are moist.  Eyes:     General: No scleral icterus.    Conjunctiva/sclera: Conjunctivae normal.  Cardiovascular:     Rate and Rhythm: Normal rate and regular rhythm.     Heart sounds: No murmur heard.   Pulmonary:     Effort: Pulmonary effort is normal.     Breath sounds: No stridor. No wheezing, rhonchi or rales.  Abdominal:     General: Abdomen is flat.     Palpations: There is no mass.     Tenderness: There is no abdominal tenderness. There is no guarding.     Hernia: There is no hernia in the left inguinal area or right inguinal area.  Genitourinary:    Pubic Area: No rash.      Penis: Normal and circumcised. No discharge, swelling or lesions.      Testes: Normal.        Right: Mass, tenderness or swelling not present.        Left: Tenderness or swelling not present.     Epididymis:     Right: Normal. Not inflamed or enlarged.     Left: Normal. Not inflamed or enlarged.     Prostate: Normal. Not enlarged, not tender and no nodules present.     Rectum: Normal. Guaiac result negative. No mass, tenderness, anal fissure,  external hemorrhoid or internal hemorrhoid. Normal anal tone.  Musculoskeletal:        General: Normal range of motion.     Cervical back: Neck supple.     Right lower leg: No edema.     Left lower leg: No edema.  Lymphadenopathy:     Cervical: No cervical adenopathy.     Lower Body: No right inguinal adenopathy. No left inguinal adenopathy.  Skin:    General: Skin is warm and dry.     Coloration: Skin is not pale.  Neurological:     General: No focal deficit present.     Mental Status: He is alert.  Psychiatric:  Mood and Affect: Mood normal.        Behavior: Behavior normal.     Lab Results  Component Value Date   WBC 6.4 11/19/2019   HGB 15.4 11/19/2019   HCT 45.5 11/19/2019   PLT 246.0 11/19/2019   GLUCOSE 157 (H) 11/19/2019   CHOL 184 11/19/2019   TRIG 95.0 11/19/2019   HDL 54.40 11/19/2019   LDLCALC 110 (H) 11/19/2019   ALT 36 11/19/2019   AST 17 11/19/2019   NA 138 11/19/2019   K 3.8 11/19/2019   CL 100 11/19/2019   CREATININE 1.32 11/19/2019   BUN 20 11/19/2019   CO2 29 11/19/2019   TSH 0.98 11/19/2019   PSA 0.75 11/19/2019   INR 1.13 04/06/2009   HGBA1C 7.5 (A) 11/19/2019   MICROALBUR 1.1 11/19/2019    No results found.  Assessment & Plan:   Jaimin was seen today for annual exam, diabetes and hyperlipidemia.  Diagnoses and all orders for this visit:  Essential hypertension- His blood pressure is not adequately well controlled.  His labs are negative for secondary causes or endorgan damage.  Will try to control his blood pressure with an ARB and calcium channel blocker.  Will also increase the dose of the SGLT2 inhibitor. -     TSH; Future -     Urinalysis, Routine w reflex microscopic; Future -     Urinalysis, Routine w reflex microscopic -     TSH -     telmisartan (MICARDIS) 40 MG tablet; Take 1 tablet (40 mg total) by mouth daily. -     amLODipine (NORVASC) 10 MG tablet; Take 1 tablet (10 mg total) by mouth daily.  B12 deficiency- His  B12 and folate levels are normal now. -     Vitamin B12; Future -     Folate; Future -     CBC with Differential/Platelet; Future -     CBC with Differential/Platelet -     Folate -     Vitamin B12  Hypersomnia with sleep apnea -     Armodafinil 250 MG tablet; Take 1 tablet (250 mg total) by mouth daily.  Vitamin D deficiency- His Vit D level is normal now. -     VITAMIN D 25 Hydroxy (Vit-D Deficiency, Fractures); Future -     VITAMIN D 25 Hydroxy (Vit-D Deficiency, Fractures)  Well adult exam  Type II diabetes mellitus with manifestations ([place])- His A1c is up to 7.5%.  He agrees to improve his lifestyle modifications.  Will increase the dose of the SGLT2 inhibitor and continue the current dose of Metformin. -     Basic metabolic panel; Future -     Microalbumin / creatinine urine ratio; Future -     Ambulatory referral to Ophthalmology -     POCT glycosylated hemoglobin (Hb A1C) -     HM Diabetes Foot Exam -     Microalbumin / creatinine urine ratio -     Basic metabolic panel -     Dapagliflozin-metFORMIN HCl ER (XIGDUO XR) 03-999 MG TB24; Take 1 tablet by mouth daily. -     telmisartan (MICARDIS) 40 MG tablet; Take 1 tablet (40 mg total) by mouth daily.  Routine general medical examination at a health care facility- Exam completed, labs reviewed, vaccines reviewed and updated, cancer screenings are up-to-date, patient education material was given. -     Lipid panel; Future -     PSA; Future -     Hepatitis C antibody; Future -  HIV Antibody (routine testing w rflx); Future -     HIV Antibody (routine testing w rflx) -     Hepatitis C antibody -     PSA -     Lipid panel  Hyperlipidemia LDL goal <100- I have asked him to start taking a statin for cardiovascular risk reduction. -     TSH; Future -     Hepatic function panel; Future -     Hepatic function panel -     TSH -     rosuvastatin (CRESTOR) 10 MG tablet; Take 1 tablet (10 mg total) by mouth daily.   I have  discontinued Robert Lab Sr.'s aspirin-acetaminophen-caffeine, ciclopirox, vardenafil, tadalafil, telmisartan-hydrochlorothiazide, and Xigduo XR. I have also changed his Armodafinil and amLODipine. Additionally, I am having him start on rosuvastatin, Xigduo XR, and telmisartan. Lastly, I am having him maintain his hydrocortisone-pramoxine, Ferrous Sulfate (IRON SUPPLEMENT PO), multivitamin, OneTouch UltraLink, glucose blood, ONE TOUCH LANCETS, lansoprazole, levocetirizine, Vitamin D3, fluticasone, Vitamin D3, omeprazole, and montelukast.  Meds ordered this encounter  Medications   rosuvastatin (CRESTOR) 10 MG tablet    Sig: Take 1 tablet (10 mg total) by mouth daily.    Dispense:  90 tablet    Refill:  1   Dapagliflozin-metFORMIN HCl ER (XIGDUO XR) 03-999 MG TB24    Sig: Take 1 tablet by mouth daily.    Dispense:  90 tablet    Refill:  1   Armodafinil 250 MG tablet    Sig: Take 1 tablet (250 mg total) by mouth daily.    Dispense:  90 tablet    Refill:  1    This request is for a new prescription for a controlled substance as required by Federal/State law..   telmisartan (MICARDIS) 40 MG tablet    Sig: Take 1 tablet (40 mg total) by mouth daily.    Dispense:  90 tablet    Refill:  1   amLODipine (NORVASC) 10 MG tablet    Sig: Take 1 tablet (10 mg total) by mouth daily.    Dispense:  90 tablet    Refill:  1   In addition to time spent on CPE, I spent 60 minutes in preparing to see the patient by review of recent labs, imaging and procedures, obtaining and reviewing separately obtained history, communicating with the patient and family or caregiver, ordering medications, tests or procedures, and documenting clinical information in the EHR including the differential Dx, treatment, and any further evaluation and other management of 1. Essential hypertension 2. B12 deficiency 3. Hypersomnia with sleep apnea 4. Vitamin D deficiency 5. Type II diabetes mellitus with manifestations  ([place]) 6. Hyperlipidemia LDL goal <100     Follow-up: Return in about 6 months (around 05/20/2020).  Scarlette Calico, MD

## 2019-11-19 NOTE — Patient Instructions (Signed)

## 2019-11-27 ENCOUNTER — Encounter: Payer: Self-pay | Admitting: Internal Medicine

## 2019-11-29 ENCOUNTER — Ambulatory Visit: Payer: BC Managed Care – PPO | Admitting: Internal Medicine

## 2020-02-20 ENCOUNTER — Telehealth: Payer: Self-pay

## 2020-02-20 NOTE — Telephone Encounter (Signed)
Key: [id number]

## 2020-02-21 NOTE — Telephone Encounter (Signed)
Per [facility]:  Medication has been approved  02/20/20 -  02/19/21  Determination has been sent to scan

## 2020-03-16 ENCOUNTER — Other Ambulatory Visit: Payer: Self-pay | Admitting: Internal Medicine

## 2020-03-16 DIAGNOSIS — E785 Hyperlipidemia, unspecified: Secondary | ICD-10-CM

## 2020-03-19 ENCOUNTER — Other Ambulatory Visit: Payer: Self-pay | Admitting: Internal Medicine

## 2020-03-19 DIAGNOSIS — E785 Hyperlipidemia, unspecified: Secondary | ICD-10-CM

## 2020-03-31 ENCOUNTER — Telehealth: Payer: Self-pay | Admitting: Internal Medicine

## 2020-03-31 NOTE — Telephone Encounter (Signed)
    Representative from an unknown [facility] [place] calling to request form completion stating they have faxed form to office. Phone [phone], ext [phone]

## 2020-04-14 ENCOUNTER — Other Ambulatory Visit: Payer: Self-pay | Admitting: Internal Medicine

## 2020-05-16 ENCOUNTER — Other Ambulatory Visit: Payer: Self-pay | Admitting: Internal Medicine

## 2020-05-17 ENCOUNTER — Other Ambulatory Visit: Payer: Self-pay | Admitting: Internal Medicine

## 2020-05-18 ENCOUNTER — Other Ambulatory Visit: Payer: Self-pay | Admitting: Internal Medicine

## 2020-05-21 ENCOUNTER — Other Ambulatory Visit: Payer: Self-pay | Admitting: *Deleted

## 2020-05-21 MED ORDER — OMEPRAZOLE 40 MG PO CPDR
DELAYED_RELEASE_CAPSULE | ORAL | 3 refills | Status: DC
Start: 2020-05-21 — End: 2020-09-22

## 2020-06-20 ENCOUNTER — Other Ambulatory Visit: Payer: Self-pay | Admitting: Internal Medicine

## 2020-06-20 DIAGNOSIS — E118 Type 2 diabetes mellitus with unspecified complications: Secondary | ICD-10-CM

## 2020-07-02 ENCOUNTER — Other Ambulatory Visit: Payer: Self-pay | Admitting: Internal Medicine

## 2020-07-02 DIAGNOSIS — G4710 Hypersomnia, unspecified: Secondary | ICD-10-CM

## 2020-07-03 DIAGNOSIS — G4710 Hypersomnia, unspecified: Secondary | ICD-10-CM

## 2020-07-03 DIAGNOSIS — E118 Type 2 diabetes mellitus with unspecified complications: Secondary | ICD-10-CM

## 2020-07-03 MED ORDER — XIGDUO XR 10-1000 MG PO TB24: 1.0000 | ORAL_TABLET | Freq: Every day | ORAL | 1 refills | Status: DC

## 2020-07-03 MED ORDER — ARMODAFINIL 250 MG PO TABS: 250.0000 mg | ORAL_TABLET | Freq: Every day | ORAL | 1 refills | Status: DC

## 2020-07-03 NOTE — Telephone Encounter (Signed)
Can you send the Armodafinil.Marland Kitchen it will not let me send it. I have sent the Xigduo.Marland KitchenJohny Chess

## 2020-07-06 MED ORDER — ARMODAFINIL 250 MG PO TABS: 250.0000 mg | ORAL_TABLET | Freq: Every day | ORAL | 0 refills | Status: DC

## 2020-07-08 MED ORDER — MONTELUKAST SODIUM 10 MG PO TABS
10.0000 mg | ORAL_TABLET | Freq: Every day | ORAL | 1 refills | Status: DC
Start: 2020-07-08 — End: 2020-09-22

## 2020-08-13 ENCOUNTER — Other Ambulatory Visit: Payer: Self-pay | Admitting: Internal Medicine

## 2020-08-13 DIAGNOSIS — G4710 Hypersomnia, unspecified: Secondary | ICD-10-CM

## 2020-08-13 DIAGNOSIS — G4730 Sleep apnea, unspecified: Secondary | ICD-10-CM

## 2020-08-14 DIAGNOSIS — G4710 Hypersomnia, unspecified: Secondary | ICD-10-CM

## 2020-08-14 MED ORDER — ARMODAFINIL 250 MG PO TABS: 250.0000 mg | ORAL_TABLET | Freq: Every day | ORAL | 0 refills | Status: DC

## 2020-08-15 ENCOUNTER — Other Ambulatory Visit: Payer: Self-pay | Admitting: Internal Medicine

## 2020-08-15 DIAGNOSIS — G4710 Hypersomnia, unspecified: Secondary | ICD-10-CM

## 2020-09-21 ENCOUNTER — Other Ambulatory Visit: Payer: Self-pay

## 2020-09-22 ENCOUNTER — Encounter: Payer: Self-pay | Admitting: Internal Medicine

## 2020-09-22 ENCOUNTER — Ambulatory Visit (INDEPENDENT_AMBULATORY_CARE_PROVIDER_SITE_OTHER): Payer: BC Managed Care – PPO | Admitting: Internal Medicine

## 2020-09-22 DIAGNOSIS — E559 Vitamin D deficiency, unspecified: Secondary | ICD-10-CM

## 2020-09-22 DIAGNOSIS — I10 Essential (primary) hypertension: Secondary | ICD-10-CM

## 2020-09-22 DIAGNOSIS — E785 Hyperlipidemia, unspecified: Secondary | ICD-10-CM

## 2020-09-22 DIAGNOSIS — Z0000 Encounter for general adult medical examination without abnormal findings: Secondary | ICD-10-CM

## 2020-09-22 DIAGNOSIS — G4730 Sleep apnea, unspecified: Secondary | ICD-10-CM

## 2020-09-22 DIAGNOSIS — E118 Type 2 diabetes mellitus with unspecified complications: Secondary | ICD-10-CM

## 2020-09-22 DIAGNOSIS — E538 Deficiency of other specified B group vitamins: Secondary | ICD-10-CM

## 2020-09-22 DIAGNOSIS — G4710 Hypersomnia, unspecified: Secondary | ICD-10-CM

## 2020-09-22 LAB — CBC WITH DIFFERENTIAL/PLATELET
Basophils Absolute: 0.1 10*3/uL (ref 0.0–0.1)
Basophils Relative: 1 % (ref 0.0–3.0)
Eosinophils Absolute: 0.1 10*3/uL (ref 0.0–0.7)
Eosinophils Relative: 1.4 % (ref 0.0–5.0)
HCT: 45.1 % (ref 39.0–52.0)
Hemoglobin: 15 g/dL (ref 13.0–17.0)
Lymphocytes Relative: 28.3 % (ref 12.0–46.0)
Lymphs Abs: 1.6 10*3/uL (ref 0.7–4.0)
MCHC: 33.3 g/dL (ref 30.0–36.0)
MCV: 88.2 fl (ref 78.0–100.0)
Monocytes Absolute: 0.3 10*3/uL (ref 0.1–1.0)
Monocytes Relative: 5.6 % (ref 3.0–12.0)
Neutro Abs: 3.7 10*3/uL (ref 1.4–7.7)
Neutrophils Relative %: 63.7 % (ref 43.0–77.0)
Platelets: 232 10*3/uL (ref 150.0–400.0)
RBC: 5.12 Mil/uL (ref 4.22–5.81)
RDW: 13.5 % (ref 11.5–15.5)
WBC: 5.7 10*3/uL (ref 4.0–10.5)

## 2020-09-22 LAB — HEMOGLOBIN A1C: Hgb A1c MFr Bld: 8.3 % — ABNORMAL HIGH (ref 4.6–6.5)

## 2020-09-22 LAB — LIPID PANEL
Cholesterol: 153 mg/dL (ref 0–200)
HDL: 57.9 mg/dL (ref >39.00)
LDL Cholesterol: 76 mg/dL (ref 0–99)
NonHDL: 94.93
Total CHOL/HDL Ratio: 3
Triglycerides: 93 mg/dL (ref 0.0–149.0)
VLDL: 18.6 mg/dL (ref 0.0–40.0)

## 2020-09-22 LAB — COMPREHENSIVE METABOLIC PANEL
ALT: 30 U/L (ref 0–53)
AST: 19 U/L (ref 0–37)
Albumin: 4.4 g/dL (ref 3.5–5.2)
Alkaline Phosphatase: 63 U/L (ref 39–117)
BUN: 16 mg/dL (ref 6–23)
CO2: 27 mEq/L (ref 19–32)
Calcium: 10 mg/dL (ref 8.4–10.5)
Chloride: 103 mEq/L (ref 96–112)
Creatinine, Ser: 1.25 mg/dL (ref 0.40–1.50)
GFR: 69.45 mL/min (ref >60.00)
Glucose, Bld: 153 mg/dL — ABNORMAL HIGH (ref 70–99)
Potassium: 4.1 mEq/L (ref 3.5–5.1)
Sodium: 140 mEq/L (ref 135–145)
Total Bilirubin: 0.5 mg/dL (ref 0.2–1.2)
Total Protein: 7.6 g/dL (ref 6.0–8.3)

## 2020-09-22 LAB — URINALYSIS
Bilirubin Urine: NEGATIVE
Hgb urine dipstick: NEGATIVE
Ketones, ur: NEGATIVE
Leukocytes,Ua: NEGATIVE
Nitrite: NEGATIVE
Specific Gravity, Urine: 1.015 (ref 1.000–1.030)
Total Protein, Urine: NEGATIVE
Urine Glucose: >=1000 — AB
Urobilinogen, UA: 0.2 (ref 0.0–1.0)
pH: 5 (ref 5.0–8.0)

## 2020-09-22 LAB — MICROALBUMIN / CREATININE URINE RATIO
Creatinine,U: 82.6 mg/dL
Microalb Creat Ratio: 2.2 mg/g (ref 0.0–30.0)
Microalb, Ur: 1.8 mg/dL (ref 0.0–1.9)

## 2020-09-22 LAB — TSH: TSH: 0.82 u[IU]/mL (ref 0.35–4.50)

## 2020-09-22 LAB — PSA: PSA: 0.92 ng/mL (ref 0.10–4.00)

## 2020-09-22 LAB — VITAMIN D 25 HYDROXY (VIT D DEFICIENCY, FRACTURES): VITD: 29.86 ng/mL — ABNORMAL LOW (ref 30.00–100.00)

## 2020-09-22 LAB — VITAMIN B12: Vitamin B-12: 482 pg/mL (ref 211–911)

## 2020-09-22 MED ORDER — OMEPRAZOLE 40 MG PO CPDR: DELAYED_RELEASE_CAPSULE | ORAL | 3 refills | Status: DC

## 2020-09-22 MED ORDER — ARMODAFINIL 250 MG PO TABS
250.0000 mg | ORAL_TABLET | Freq: Every day | ORAL | 1 refills | Status: DC
Start: 2020-09-22 — End: 2021-06-01

## 2020-09-22 MED ORDER — ROSUVASTATIN CALCIUM 10 MG PO TABS: 10.0000 mg | ORAL_TABLET | Freq: Every day | ORAL | 3 refills | Status: DC

## 2020-09-22 MED ORDER — FLUTICASONE PROPIONATE 50 MCG/ACT NA SUSP: NASAL | 3 refills | Status: DC

## 2020-09-22 MED ORDER — MONTELUKAST SODIUM 10 MG PO TABS: 10.0000 mg | ORAL_TABLET | Freq: Every day | ORAL | 3 refills | Status: DC

## 2020-09-22 MED ORDER — AMLODIPINE BESYLATE 10 MG PO TABS: 10.0000 mg | ORAL_TABLET | Freq: Every day | ORAL | 3 refills | Status: DC

## 2020-09-22 MED ORDER — TELMISARTAN-HCTZ 80-25 MG PO TABS: 1.0000 | ORAL_TABLET | Freq: Every day | ORAL | 3 refills | Status: DC

## 2020-09-22 MED ORDER — XIGDUO XR 5-1000 MG PO TB24: 1.0000 | ORAL_TABLET | Freq: Two times a day (BID) | ORAL | 3 refills | Status: DC

## 2020-09-22 MED ORDER — LEVOCETIRIZINE DIHYDROCHLORIDE 5 MG PO TABS: 5.0000 mg | ORAL_TABLET | Freq: Every evening | ORAL | 3 refills | Status: DC

## 2020-09-22 NOTE — Assessment & Plan Note (Signed)
On Armodofinil
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
## 2020-09-22 NOTE — Assessment & Plan Note (Signed)
Dapagliflozin-Metformin (Xigduo XR) A cardiac CT scan for calcium scoring offered  See lab orders

## 2020-09-22 NOTE — Assessment & Plan Note (Signed)
   We discussed age appropriate health related issues, including available/recomended screening tests and vaccinations. Labs were ordered to be later reviewed . All questions were answered. We discussed one or more of the following - seat belt use, use of sunscreen/sun exposure exercise, safe sex, fall risk reduction, second hand smoke exposure, firearm use and storage, seat belt use, a need for adhering to healthy diet and exercise. Labs were ordered. All questions were answered.   A cardiac CT scan for calcium scoring offered 
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
## 2020-09-22 NOTE — Progress Notes (Signed)
Subjective:  Patient ID: Robert Lab Sr., male    DOB: 10-Jul-1975  Age: 45 y.o. MRN: [id number]  CC: Annual Exam   HPI Robert Narine Sr. presents for a well exam F/u DM, GERD, HTN f/u  Outpatient Medications Prior to Visit  Medication Sig Dispense Refill  . amLODipine (NORVASC) 10 MG tablet Take 1 tablet (10 mg total) by mouth daily. 90 tablet 1  . Armodafinil 250 MG tablet TAKE 1 TABLET BY MOUTH EVERY DAY 30 tablet 0  . Blood Glucose Monitoring Suppl (ONETOUCH ULTRALINK) w/Device KIT As directed 1 each 3  . Cholecalciferol (VITAMIN D3) 2000 units capsule Take 1 capsule (2,000 Units total) by mouth daily. 100 capsule 3  . Dapagliflozin-metFORMIN HCl ER (XIGDUO XR) 03-999 MG TB24 Take 1 tablet by mouth daily. 90 tablet 1  . Ferrous Sulfate (IRON SUPPLEMENT PO) Take 1 each by mouth daily.    . fluticasone (FLONASE) 50 MCG/ACT nasal spray PLACE 2 SPRAY EACH NOSTRIL EVERY DAY 16 g 5  . glucose blood test strip Use as instructed 50 each 12  . hydrocortisone-pramoxine (PROCTOFOAM-HC) rectal foam Place 1 applicator rectally 2 (two) times daily. As needed.    . lansoprazole (PREVACID) 30 MG capsule Take 1 capsule (30 mg total) by mouth as needed. 90 capsule 3  . levocetirizine (XYZAL) 5 MG tablet Take 1 tablet (5 mg total) by mouth every evening. 90 tablet 3  . montelukast (SINGULAIR) 10 MG tablet Take 1 tablet (10 mg total) by mouth daily. 90 tablet 1  . multivitamin (ONE-A-DAY MEN'S) TABS tablet Take 1 tablet by mouth daily.    Marland Kitchen omeprazole (PRILOSEC) 40 MG capsule TAKE 1 CAPSULE BY MOUTH EVERY DAY 90 capsule 3  . ONE TOUCH LANCETS MISC As directed 100 each 3  . rosuvastatin (CRESTOR) 10 MG tablet TAKE 1 TABLET BY MOUTH EVERY DAY 90 tablet 1  . telmisartan (MICARDIS) 40 MG tablet Take 1 tablet (40 mg total) by mouth daily. 90 tablet 1  . telmisartan-hydrochlorothiazide (MICARDIS HCT) 80-25 MG tablet TAKE 1 TABLET BY MOUTH EVERY DAY 90 tablet 3  . XIGDUO XR 10-998 MG TB24 TAKE 1 TABLET  BY MOUTH TWICE A DAY 180 tablet 2   No facility-administered medications prior to visit.    ROS: Review of Systems  Constitutional: Negative for appetite change, fatigue and unexpected weight change.  HENT: Negative for congestion, nosebleeds, sneezing, sore throat and trouble swallowing.   Eyes: Negative for itching and visual disturbance.  Respiratory: Negative for cough.   Cardiovascular: Negative for chest pain, palpitations and leg swelling.  Gastrointestinal: Negative for abdominal distention, blood in stool, diarrhea and nausea.  Genitourinary: Negative for frequency and hematuria.  Musculoskeletal: Negative for back pain, gait problem, joint swelling and neck pain.  Skin: Negative for rash.  Neurological: Negative for dizziness, tremors, speech difficulty and weakness.  Psychiatric/Behavioral: Negative for agitation, dysphoric mood and sleep disturbance. The patient is not nervous/anxious.     Objective:  BP (!) 144/88 (BP Location: Left Arm)   Pulse 95   Temp 98.1 F (36.7 C) (Oral)   Ht _0  (1.93 m)   Wt 287 lb (130.2 kg)   SpO2 97%   BMI 34.93 kg/m   BP Readings from Last 3 Encounters:  09/22/20 (!) 144/88  11/19/19 (!) 156/94  07/30/18 120/76    Wt Readings from Last 3 Encounters:  09/22/20 287 lb (130.2 kg)  11/19/19 290 lb (131.5 kg)  07/30/18 292 lb (132.5 kg)  Physical Exam Constitutional:      General: He is not in acute distress.    Appearance: He is well-developed.     Comments: NAD  Eyes:     Conjunctiva/sclera: Conjunctivae normal.     Pupils: Pupils are equal, round, and reactive to light.  Neck:     Thyroid: No thyromegaly.     Vascular: No JVD.  Cardiovascular:     Rate and Rhythm: Normal rate and regular rhythm.     Heart sounds: Normal heart sounds. No murmur heard. No friction rub. No gallop.   Pulmonary:     Effort: Pulmonary effort is normal. No respiratory distress.     Breath sounds: Normal breath sounds. No wheezing or  rales.  Chest:     Chest wall: No tenderness.  Abdominal:     General: Bowel sounds are normal. There is no distension.     Palpations: Abdomen is soft. There is no mass.     Tenderness: There is no abdominal tenderness. There is no guarding or rebound.  Musculoskeletal:        General: No tenderness. Normal range of motion.     Cervical back: Normal range of motion.  Lymphadenopathy:     Cervical: No cervical adenopathy.  Skin:    General: Skin is warm and dry.     Findings: No rash.  Neurological:     Mental Status: He is alert and oriented to person, place, and time.     Cranial Nerves: No cranial nerve deficit.     Motor: No abnormal muscle tone.     Coordination: Coordination normal.     Gait: Gait normal.     Deep Tendon Reflexes: Reflexes are normal and symmetric.  Psychiatric:        Behavior: Behavior normal.        Thought Content: Thought content normal.        Judgment: Judgment normal.     Lab Results  Component Value Date   WBC 6.4 11/19/2019   HGB 15.4 11/19/2019   HCT 45.5 11/19/2019   PLT 246.0 11/19/2019   GLUCOSE 157 (H) 11/19/2019   CHOL 184 11/19/2019   TRIG 95.0 11/19/2019   HDL 54.40 11/19/2019   LDLCALC 110 (H) 11/19/2019   ALT 36 11/19/2019   AST 17 11/19/2019   NA 138 11/19/2019   K 3.8 11/19/2019   CL 100 11/19/2019   CREATININE 1.32 11/19/2019   BUN 20 11/19/2019   CO2 29 11/19/2019   TSH 0.98 11/19/2019   PSA 0.75 11/19/2019   INR 1.13 04/06/2009   HGBA1C 7.5 (A) 11/19/2019   MICROALBUR 1.1 11/19/2019    No results found.  Assessment & Plan:    Walker Kehr, MD

## 2020-12-18 ENCOUNTER — Encounter: Payer: Self-pay | Admitting: Internal Medicine

## 2020-12-18 ENCOUNTER — Telehealth (INDEPENDENT_AMBULATORY_CARE_PROVIDER_SITE_OTHER): Payer: Self-pay | Admitting: Internal Medicine

## 2020-12-18 ENCOUNTER — Telehealth: Payer: Self-pay | Admitting: Family Medicine

## 2020-12-18 DIAGNOSIS — J069 Acute upper respiratory infection, unspecified: Secondary | ICD-10-CM | POA: Insufficient documentation

## 2020-12-18 MED ORDER — PROMETHAZINE-DM 6.25-15 MG/5ML PO SYRP: 5.0000 mL | ORAL_SOLUTION | Freq: Two times a day (BID) | ORAL | 0 refills | Status: DC | PRN

## 2020-12-18 NOTE — Progress Notes (Signed)
Virtual Visit via Video Note  I connected with Robert Lab Sr. on 12/18/20 at  1:40 PM EDT by a video enabled telemedicine application and verified that I am speaking with the correct person using two identifiers.  The patient and the provider were at separate locations throughout the entire encounter. Patient location: home, Provider location: work   I discussed the limitations of evaluation and management by telemedicine and the availability of in person appointments. The patient expressed understanding and agreed to proceed. The patient and the provider were the only parties present for the visit unless noted in HPI below.  History of Present Illness: The patient is a 45 y.o. man with visit for cough and nasal drainage. Started 3 days ago. Is taking xyzal and zyrtec and tylenol cold and sinus with partial relief. No home covid-19 test taken. Has no fevers or chills or body aches. Denies known sick contacts. Overall it is stable. Has had 3 covid-19 vaccine.  Observations/Objective: Appearance: normal, breathing appears normal, some coughing and throat clearing during visit, casual grooming, abdomen does not appear distended, throat not well visualized, mental status is A and O times 3  Assessment and Plan: See problem oriented charting  Follow Up Instructions: rx promethazine/dm cough syrup, advised to take home covid-19 test which he has at home  I discussed the assessment and treatment plan with the patient. The patient was provided an opportunity to ask questions and all were answered. The patient agreed with the plan and demonstrated an understanding of the instructions.   The patient was advised to call back or seek an in-person evaluation if the symptoms worsen or if the condition fails to improve as anticipated.  Robert Koch, MD

## 2020-12-18 NOTE — Assessment & Plan Note (Signed)
Advised to take home covid-19 test. Advised to continue singulair, xyzal, and zyrtec. Rx promethazine/dm cough syrup for night time. Advised if covid-19 positive likely would not need antiviral due to age and overall mild symptoms.

## 2021-01-13 ENCOUNTER — Ambulatory Visit: Payer: Self-pay | Admitting: Internal Medicine

## 2021-01-17 ENCOUNTER — Other Ambulatory Visit: Payer: Self-pay | Admitting: Internal Medicine

## 2021-01-21 ENCOUNTER — Ambulatory Visit: Payer: BC Managed Care – PPO | Admitting: Internal Medicine

## 2021-01-21 ENCOUNTER — Other Ambulatory Visit: Payer: Self-pay

## 2021-01-21 ENCOUNTER — Encounter: Payer: Self-pay | Admitting: Internal Medicine

## 2021-01-21 VITALS — BP 132/80 | HR 102 | Temp 98.2°F | Ht 76.0 in | Wt 282.0 lb

## 2021-01-21 DIAGNOSIS — E118 Type 2 diabetes mellitus with unspecified complications: Secondary | ICD-10-CM | POA: Diagnosis not present

## 2021-01-21 DIAGNOSIS — G4730 Sleep apnea, unspecified: Secondary | ICD-10-CM

## 2021-01-21 DIAGNOSIS — F17290 Nicotine dependence, other tobacco product, uncomplicated: Secondary | ICD-10-CM | POA: Diagnosis not present

## 2021-01-21 DIAGNOSIS — I10 Essential (primary) hypertension: Secondary | ICD-10-CM | POA: Diagnosis not present

## 2021-01-21 DIAGNOSIS — B351 Tinea unguium: Secondary | ICD-10-CM

## 2021-01-21 DIAGNOSIS — E538 Deficiency of other specified B group vitamins: Secondary | ICD-10-CM

## 2021-01-21 DIAGNOSIS — G4710 Hypersomnia, unspecified: Secondary | ICD-10-CM

## 2021-01-21 DIAGNOSIS — L219 Seborrheic dermatitis, unspecified: Secondary | ICD-10-CM

## 2021-01-21 MED ORDER — CICLOPIROX 8 % EX SOLN: Freq: Every day | CUTANEOUS | 1 refills | Status: DC

## 2021-01-21 MED ORDER — PSEUDOEPHEDRINE HCL ER 120 MG PO TB12: 120.0000 mg | ORAL_TABLET | Freq: Two times a day (BID) | ORAL | 2 refills | Status: DC | PRN

## 2021-01-21 MED ORDER — TIRZEPATIDE 2.5 MG/0.5ML ~~LOC~~ SOAJ: 2.5000 mg | SUBCUTANEOUS | 2 refills | Status: DC

## 2021-01-21 MED ORDER — CLOTRIMAZOLE-BETAMETHASONE 1-0.05 % EX CREA: 1.0000 "application " | TOPICAL_CREAM | Freq: Two times a day (BID) | CUTANEOUS | 2 refills | Status: DC

## 2021-01-21 NOTE — Progress Notes (Signed)
Subjective:  Patient ID: Robert Gip Sr., male    DOB: 08/11/1975  Age: 45 y.o. MRN: [id number]  CC: Follow-up (4 month f/u)   HPI Robert Gip Sr. presents for a number of concerns.  He is complaining of obesity, DM, dyslipidemia. C/o dry and flaky skin in the ears. C/toenail fungus, reoccurred. C/o phlegm in the throat after smoking cigars.  He does it twice a week.  He does not inhale   Outpatient Medications Prior to Visit  Medication Sig Dispense Refill   amLODipine (NORVASC) 10 MG tablet Take 1 tablet (10 mg total) by mouth daily. 90 tablet 3   Armodafinil 250 MG tablet Take 1 tablet (250 mg total) by mouth daily. 90 tablet 1   Blood Glucose Monitoring Suppl (ONETOUCH ULTRALINK) w/Device KIT As directed 1 each 3   Cholecalciferol (VITAMIN D3) 2000 units capsule Take 1 capsule (2,000 Units total) by mouth daily. 100 capsule 3   Dapagliflozin-metFORMIN HCl ER (XIGDUO XR) 10-998 MG TB24 Take 1 tablet by mouth 2 (two) times daily. 180 tablet 3   Ferrous Sulfate (IRON SUPPLEMENT PO) Take 1 each by mouth daily.     fluticasone (FLONASE) 50 MCG/ACT nasal spray PLACE 2 SPRAY EACH NOSTRIL EVERY DAY 48 g 3   glucose blood test strip Use as instructed 50 each 12   hydrocortisone-pramoxine (PROCTOFOAM-HC) rectal foam Place 1 applicator rectally 2 (two) times daily. As needed.     levocetirizine (XYZAL) 5 MG tablet Take 1 tablet (5 mg total) by mouth every evening. 90 tablet 3   montelukast (SINGULAIR) 10 MG tablet Take 1 tablet (10 mg total) by mouth daily. 90 tablet 3   multivitamin (ONE-A-DAY MEN'S) TABS tablet Take 1 tablet by mouth daily.     omeprazole (PRILOSEC) 40 MG capsule TAKE 1 CAPSULE BY MOUTH EVERY DAY 90 capsule 3   ONE TOUCH LANCETS MISC As directed 100 each 3   rosuvastatin (CRESTOR) 10 MG tablet Take 1 tablet (10 mg total) by mouth daily. 90 tablet 3   telmisartan-hydrochlorothiazide (MICARDIS HCT) 80-25 MG tablet Take 1 tablet by mouth daily. 90 tablet 3    promethazine-dextromethorphan (PROMETHAZINE-DM) 6.25-15 MG/5ML syrup TAKE 5 MLS BY MOUTH 2 (TWO) TIMES DAILY AS NEEDED FOR COUGH. (Patient not taking: Reported on 01/21/2021) 118 mL 0   No facility-administered medications prior to visit.    ROS: Review of Systems  Constitutional:  Negative for appetite change, fatigue and unexpected weight change.  HENT:  Negative for congestion, nosebleeds, sneezing, sore throat and trouble swallowing.   Eyes:  Negative for itching and visual disturbance.  Respiratory:  Negative for cough.   Cardiovascular:  Negative for chest pain, palpitations and leg swelling.  Gastrointestinal:  Negative for abdominal distention, blood in stool, diarrhea and nausea.  Genitourinary:  Negative for frequency and hematuria.  Musculoskeletal:  Positive for arthralgias. Negative for back pain, gait problem, joint swelling and neck pain.  Skin:  Negative for rash.  Neurological:  Negative for dizziness, tremors, speech difficulty and weakness.  Psychiatric/Behavioral:  Negative for agitation, dysphoric mood and sleep disturbance. The patient is not nervous/anxious.    Objective:  BP 132/80 (BP Location: Left Arm)   Pulse (!) 102   Temp 98.2 F (36.8 C) (Oral)   Ht 6\' 4"  (1.93 m)   Wt 282 lb (127.9 kg)   SpO2 96%   BMI 34.33 kg/m   BP Readings from Last 3 Encounters:  01/21/21 132/80  09/22/20 (!) 144/88  11/19/19 (!) 156/94  Wt Readings from Last 3 Encounters:  01/21/21 282 lb (127.9 kg)  09/22/20 287 lb (130.2 kg)  11/19/19 290 lb (131.5 kg)    Physical Exam Constitutional:      General: He is not in acute distress.    Appearance: He is well-developed. He is obese.     Comments: NAD  Eyes:     Conjunctiva/sclera: Conjunctivae normal.     Pupils: Pupils are equal, round, and reactive to light.  Neck:     Thyroid: No thyromegaly.     Vascular: No JVD.  Cardiovascular:     Rate and Rhythm: Normal rate and regular rhythm.     Heart sounds: Normal  heart sounds. No murmur heard.   No friction rub. No gallop.  Pulmonary:     Effort: Pulmonary effort is normal. No respiratory distress.     Breath sounds: Normal breath sounds. No wheezing or rales.  Chest:     Chest wall: No tenderness.  Abdominal:     General: Bowel sounds are normal. There is no distension.     Palpations: Abdomen is soft. There is no mass.     Tenderness: There is no abdominal tenderness. There is no guarding or rebound.  Musculoskeletal:        General: No tenderness. Normal range of motion.     Cervical back: Normal range of motion.  Lymphadenopathy:     Cervical: No cervical adenopathy.  Skin:    General: Skin is warm and dry.     Findings: No rash.  Neurological:     Mental Status: He is alert and oriented to person, place, and time.     Cranial Nerves: No cranial nerve deficit.     Motor: No abnormal muscle tone.     Coordination: Coordination normal.     Gait: Gait normal.     Deep Tendon Reflexes: Reflexes are normal and symmetric.  Psychiatric:        Behavior: Behavior normal.        Thought Content: Thought content normal.        Judgment: Judgment normal.    Lab Results  Component Value Date   WBC 5.7 09/22/2020   HGB 15.0 09/22/2020   HCT 45.1 09/22/2020   PLT 232.0 09/22/2020   GLUCOSE 153 (H) 09/22/2020   CHOL 153 09/22/2020   TRIG 93.0 09/22/2020   HDL 57.90 09/22/2020   LDLCALC 76 09/22/2020   ALT 30 09/22/2020   AST 19 09/22/2020   NA 140 09/22/2020   K 4.1 09/22/2020   CL 103 09/22/2020   CREATININE 1.25 09/22/2020   BUN 16 09/22/2020   CO2 27 09/22/2020   TSH 0.82 09/22/2020   PSA 0.92 09/22/2020   INR 1.13 04/06/2009   HGBA1C 8.3 (H) 09/22/2020   MICROALBUR 1.8 09/22/2020    No results found.  Assessment & Plan:     Walker Kehr, MD

## 2021-01-24 DIAGNOSIS — L219 Seborrheic dermatitis, unspecified: Secondary | ICD-10-CM | POA: Insufficient documentation

## 2021-01-24 DIAGNOSIS — F17290 Nicotine dependence, other tobacco product, uncomplicated: Secondary | ICD-10-CM | POA: Insufficient documentation

## 2021-01-24 NOTE — Assessment & Plan Note (Signed)
external ears and face.  Prescribed Lotrisone cream as needed

## 2021-01-24 NOTE — Assessment & Plan Note (Signed)
Will start Mounjaro.  Hopefully it will help Robert Bridges to lose weight.  Information provided.  Continue Xigduo XR

## 2021-01-24 NOTE — Assessment & Plan Note (Signed)
Continue with Micardis A cardiac CT scan for calcium scoring offered

## 2021-01-24 NOTE — Assessment & Plan Note (Signed)
Relapsed.  Prescribed Penlac to use on toenails

## 2021-01-24 NOTE — Assessment & Plan Note (Signed)
Continue with Armodofinil and CPAP

## 2021-01-24 NOTE — Assessment & Plan Note (Signed)
Mild. C/o phlegm in the throat after smoking cigars.  He does it twice a week.  He does not inhale.  Obviously he needs to quit to resolve the issue.  She will try Sudafed as needed on days when he smokes to relieve congestion.  There is no red flags, i.e. pain, dysphagia etc.

## 2021-01-24 NOTE — Assessment & Plan Note (Signed)
Continue with vitamin B12 treatment

## 2021-01-28 ENCOUNTER — Telehealth: Payer: Self-pay | Admitting: Internal Medicine

## 2021-01-28 NOTE — Telephone Encounter (Signed)
Key: [id number]   PA scanned in media

## 2021-02-02 NOTE — Telephone Encounter (Signed)
Submitted PA via cover-my-meds w/  (Key: [id number]). Waiting on insurance determination....Robert Bridges

## 2021-02-03 NOTE — Telephone Encounter (Signed)
Check status on PA received msg stating med was approved effective 02/03/2021 - 02/03/2022. Faxed approval to [facility]../lmb

## 2021-02-11 ENCOUNTER — Other Ambulatory Visit: Payer: Self-pay | Admitting: Internal Medicine

## 2021-02-11 DIAGNOSIS — E118 Type 2 diabetes mellitus with unspecified complications: Secondary | ICD-10-CM

## 2021-03-11 NOTE — Telephone Encounter (Signed)
PA submitted via cover-my-meds w/  (Key: [id number]). Waiting on insurance determination.Marland KitchenJohny Chess

## 2021-03-16 NOTE — Telephone Encounter (Signed)
Check status for PA. Med has been approved from 03/11/2021 - 03/11/2022. Sent pt msg via mychart w/ status.Marland KitchenJohny Chess

## 2021-04-26 ENCOUNTER — Other Ambulatory Visit: Payer: Self-pay | Admitting: Internal Medicine

## 2021-06-01 ENCOUNTER — Other Ambulatory Visit: Payer: Self-pay | Admitting: Internal Medicine

## 2021-06-01 ENCOUNTER — Encounter: Payer: Self-pay | Admitting: Internal Medicine

## 2021-06-01 DIAGNOSIS — G4710 Hypersomnia, unspecified: Secondary | ICD-10-CM

## 2021-09-14 ENCOUNTER — Other Ambulatory Visit: Payer: Self-pay | Admitting: Internal Medicine

## 2021-09-14 DIAGNOSIS — G4710 Hypersomnia, unspecified: Secondary | ICD-10-CM

## 2021-09-17 ENCOUNTER — Other Ambulatory Visit: Payer: Self-pay | Admitting: Internal Medicine

## 2021-09-17 DIAGNOSIS — G4710 Hypersomnia, unspecified: Secondary | ICD-10-CM

## 2021-09-20 NOTE — Telephone Encounter (Signed)
Pls advise on control substance..Chryl Heck ?

## 2021-09-23 MED ORDER — ARMODAFINIL 250 MG PO TABS: 250.0000 mg | ORAL_TABLET | Freq: Every day | ORAL | 0 refills | Status: DC

## 2021-09-25 ENCOUNTER — Other Ambulatory Visit: Payer: Self-pay | Admitting: Internal Medicine

## 2021-09-25 DIAGNOSIS — I10 Essential (primary) hypertension: Secondary | ICD-10-CM

## 2021-10-11 ENCOUNTER — Other Ambulatory Visit: Payer: Self-pay | Admitting: Internal Medicine

## 2021-10-11 DIAGNOSIS — I10 Essential (primary) hypertension: Secondary | ICD-10-CM

## 2021-10-21 ENCOUNTER — Other Ambulatory Visit: Payer: Self-pay | Admitting: Internal Medicine

## 2021-10-27 ENCOUNTER — Other Ambulatory Visit: Payer: Self-pay | Admitting: Internal Medicine

## 2021-10-27 DIAGNOSIS — I10 Essential (primary) hypertension: Secondary | ICD-10-CM

## 2021-10-28 ENCOUNTER — Other Ambulatory Visit: Payer: Self-pay | Admitting: Internal Medicine

## 2021-10-30 ENCOUNTER — Other Ambulatory Visit: Payer: Self-pay | Admitting: Internal Medicine

## 2021-10-30 DIAGNOSIS — G4710 Hypersomnia, unspecified: Secondary | ICD-10-CM

## 2021-10-30 DIAGNOSIS — G4730 Sleep apnea, unspecified: Secondary | ICD-10-CM

## 2021-11-05 ENCOUNTER — Encounter: Payer: Self-pay | Admitting: Family Medicine

## 2021-11-05 ENCOUNTER — Encounter: Payer: Self-pay | Admitting: Internal Medicine

## 2021-11-05 ENCOUNTER — Telehealth: Payer: Self-pay | Admitting: Internal Medicine

## 2021-11-05 ENCOUNTER — Ambulatory Visit: Payer: BC Managed Care – PPO | Admitting: Family Medicine

## 2021-11-05 ENCOUNTER — Other Ambulatory Visit: Payer: Self-pay | Admitting: Internal Medicine

## 2021-11-05 VITALS — BP 144/90 | HR 90 | Temp 97.8°F | Ht 76.0 in | Wt 280.0 lb

## 2021-11-05 DIAGNOSIS — I10 Essential (primary) hypertension: Secondary | ICD-10-CM

## 2021-11-05 DIAGNOSIS — G4710 Hypersomnia, unspecified: Secondary | ICD-10-CM

## 2021-11-05 DIAGNOSIS — G4730 Sleep apnea, unspecified: Secondary | ICD-10-CM

## 2021-11-05 DIAGNOSIS — E118 Type 2 diabetes mellitus with unspecified complications: Secondary | ICD-10-CM | POA: Diagnosis not present

## 2021-11-05 MED ORDER — TELMISARTAN-HCTZ 80-25 MG PO TABS: 1.0000 | ORAL_TABLET | Freq: Every day | ORAL | 0 refills | Status: DC

## 2021-11-05 MED ORDER — ARMODAFINIL 250 MG PO TABS: 250.0000 mg | ORAL_TABLET | Freq: Every day | ORAL | 0 refills | Status: DC
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
## 2021-11-05 NOTE — Patient Instructions (Signed)
Dr. Alain Marion has requested that you return for a visit to have labs done and to refill your medications long term.

## 2021-11-11 ENCOUNTER — Encounter: Payer: Self-pay | Admitting: Internal Medicine

## 2021-11-11 ENCOUNTER — Other Ambulatory Visit: Payer: Self-pay | Admitting: Internal Medicine

## 2021-11-11 ENCOUNTER — Telehealth: Payer: Self-pay | Admitting: *Deleted

## 2021-11-11 ENCOUNTER — Ambulatory Visit (INDEPENDENT_AMBULATORY_CARE_PROVIDER_SITE_OTHER): Payer: BC Managed Care – PPO | Admitting: Internal Medicine

## 2021-11-11 VITALS — BP 144/88 | HR 84 | Temp 98.7°F | Ht 76.0 in | Wt 280.0 lb

## 2021-11-11 DIAGNOSIS — E118 Type 2 diabetes mellitus with unspecified complications: Secondary | ICD-10-CM | POA: Diagnosis not present

## 2021-11-11 DIAGNOSIS — I10 Essential (primary) hypertension: Secondary | ICD-10-CM | POA: Diagnosis not present

## 2021-11-11 DIAGNOSIS — G4710 Hypersomnia, unspecified: Secondary | ICD-10-CM

## 2021-11-11 DIAGNOSIS — Z125 Encounter for screening for malignant neoplasm of prostate: Secondary | ICD-10-CM | POA: Diagnosis not present

## 2021-11-11 DIAGNOSIS — G4730 Sleep apnea, unspecified: Secondary | ICD-10-CM

## 2021-11-11 DIAGNOSIS — Z0000 Encounter for general adult medical examination without abnormal findings: Secondary | ICD-10-CM

## 2021-11-11 LAB — CBC WITH DIFFERENTIAL/PLATELET
Basophils Absolute: 0.1 10*3/uL (ref 0.0–0.1)
Basophils Relative: 1 % (ref 0.0–3.0)
Eosinophils Absolute: 0.1 10*3/uL (ref 0.0–0.7)
Eosinophils Relative: 1.9 % (ref 0.0–5.0)
HCT: 45.9 % (ref 39.0–52.0)
Hemoglobin: 15.6 g/dL (ref 13.0–17.0)
Lymphocytes Relative: 34.8 % (ref 12.0–46.0)
Lymphs Abs: 2.2 10*3/uL (ref 0.7–4.0)
MCHC: 33.9 g/dL (ref 30.0–36.0)
MCV: 87.4 fl (ref 78.0–100.0)
Monocytes Absolute: 0.3 10*3/uL (ref 0.1–1.0)
Monocytes Relative: 4.9 % (ref 3.0–12.0)
Neutro Abs: 3.7 10*3/uL (ref 1.4–7.7)
Neutrophils Relative %: 57.4 % (ref 43.0–77.0)
Platelets: 269 10*3/uL (ref 150.0–400.0)
RBC: 5.25 Mil/uL (ref 4.22–5.81)
RDW: 13.4 % (ref 11.5–15.5)
WBC: 6.4 10*3/uL (ref 4.0–10.5)

## 2021-11-11 LAB — LIPID PANEL
Cholesterol: 198 mg/dL (ref 0–200)
HDL: 60.6 mg/dL (ref >39.00)
LDL Cholesterol: 118 mg/dL — ABNORMAL HIGH (ref 0–99)
NonHDL: 137.2
Total CHOL/HDL Ratio: 3
Triglycerides: 98 mg/dL (ref 0.0–149.0)
VLDL: 19.6 mg/dL (ref 0.0–40.0)

## 2021-11-11 LAB — COMPREHENSIVE METABOLIC PANEL
ALT: 43 U/L (ref 0–53)
AST: 24 U/L (ref 0–37)
Albumin: 4.9 g/dL (ref 3.5–5.2)
Alkaline Phosphatase: 56 U/L (ref 39–117)
BUN: 17 mg/dL (ref 6–23)
CO2: 29 mEq/L (ref 19–32)
Calcium: 10.8 mg/dL — ABNORMAL HIGH (ref 8.4–10.5)
Chloride: 97 mEq/L (ref 96–112)
Creatinine, Ser: 1.35 mg/dL (ref 0.40–1.50)
GFR: 62.82 mL/min (ref >60.00)
Glucose, Bld: 207 mg/dL — ABNORMAL HIGH (ref 70–99)
Potassium: 4.1 mEq/L (ref 3.5–5.1)
Sodium: 139 mEq/L (ref 135–145)
Total Bilirubin: 1 mg/dL (ref 0.2–1.2)
Total Protein: 7.9 g/dL (ref 6.0–8.3)

## 2021-11-11 LAB — TSH: TSH: 1.01 u[IU]/mL (ref 0.35–5.50)

## 2021-11-11 LAB — URINALYSIS
Bilirubin Urine: NEGATIVE
Hgb urine dipstick: NEGATIVE
Ketones, ur: NEGATIVE
Leukocytes,Ua: NEGATIVE
Nitrite: NEGATIVE
Specific Gravity, Urine: 1.015 (ref 1.000–1.030)
Total Protein, Urine: NEGATIVE
Urine Glucose: >=1000 — AB
Urobilinogen, UA: 0.2 (ref 0.0–1.0)
pH: 5.5 (ref 5.0–8.0)

## 2021-11-11 LAB — MICROALBUMIN / CREATININE URINE RATIO
Creatinine,U: 35.5 mg/dL
Microalb Creat Ratio: 3.4 mg/g (ref 0.0–30.0)
Microalb, Ur: 1.2 mg/dL (ref 0.0–1.9)

## 2021-11-11 LAB — HEMOGLOBIN A1C: Hgb A1c MFr Bld: 9.6 % — ABNORMAL HIGH (ref 4.6–6.5)

## 2021-11-11 LAB — PSA: PSA: 0.8 ng/mL (ref 0.10–4.00)

## 2021-11-11 MED ORDER — FLUTICASONE PROPIONATE 50 MCG/ACT NA SUSP: NASAL | 3 refills | Status: DC

## 2021-11-11 MED ORDER — ARMODAFINIL 250 MG PO TABS: 250.0000 mg | ORAL_TABLET | Freq: Every day | ORAL | 1 refills | Status: DC

## 2021-11-11 MED ORDER — AMLODIPINE BESYLATE 10 MG PO TABS: 10.0000 mg | ORAL_TABLET | Freq: Every day | ORAL | 3 refills | Status: DC

## 2021-11-11 MED ORDER — PSEUDOEPHEDRINE HCL ER 120 MG PO TB12: 120.0000 mg | ORAL_TABLET | Freq: Two times a day (BID) | ORAL | 2 refills | Status: AC | PRN

## 2021-11-11 MED ORDER — TIRZEPATIDE 2.5 MG/0.5ML ~~LOC~~ SOAJ
2.5000 mg | SUBCUTANEOUS | 2 refills | Status: DC
Start: 2021-11-11 — End: 2021-11-13

## 2021-11-11 MED ORDER — LEVOCETIRIZINE DIHYDROCHLORIDE 5 MG PO TABS: 5.0000 mg | ORAL_TABLET | Freq: Every evening | ORAL | 3 refills | Status: DC

## 2021-11-11 MED ORDER — TELMISARTAN-HCTZ 80-25 MG PO TABS: 1.0000 | ORAL_TABLET | Freq: Every day | ORAL | 3 refills | Status: DC

## 2021-11-11 MED ORDER — ONE-A-DAY MENS PO TABS
1.0000 | ORAL_TABLET | Freq: Every day | ORAL | 3 refills | Status: DC
Start: 2021-11-11 — End: 2023-04-07

## 2021-11-11 MED ORDER — XIGDUO XR 10-1000 MG PO TB24: 1.0000 | ORAL_TABLET | Freq: Every day | ORAL | 3 refills | Status: DC

## 2021-11-11 MED ORDER — OMEPRAZOLE 40 MG PO CPDR: DELAYED_RELEASE_CAPSULE | ORAL | 3 refills | Status: DC

## 2021-11-11 MED ORDER — MONTELUKAST SODIUM 10 MG PO TABS: 10.0000 mg | ORAL_TABLET | Freq: Every day | ORAL | 3 refills | Status: DC

## 2021-11-11 NOTE — Telephone Encounter (Signed)
Rec'd PA pt needing for Mounjaro 2.5. completed with cover-my-meds w/ Key: [id number] . Sent to plan waiting on determination.Marland KitchenJohny Chess

## 2021-11-11 NOTE — Progress Notes (Unsigned)
Subjective:  Patient ID: Robert Gip Sr., male    DOB: 1975/06/21  Age: 46 y.o. MRN: [id number]  CC: No chief complaint on file.   HPI Robert Gip Sr. presents for DM, HTN, dyslipidemia, somnolence  Outpatient Medications Prior to Visit  Medication Sig Dispense Refill   Cholecalciferol (VITAMIN D3) 2000 units capsule Take 1 capsule (2,000 Units total) by mouth daily. 100 capsule 3   Ferrous Sulfate (IRON SUPPLEMENT PO) Take 1 each by mouth daily.     hydrocortisone-pramoxine (PROCTOFOAM-HC) rectal foam Place 1 applicator rectally 2 (two) times daily. As needed.     amLODipine (NORVASC) 10 MG tablet Take 1 tablet (10 mg total) by mouth daily. Overdue for follow-up appt must see provider for future refills 30 tablet 0   Armodafinil 250 MG tablet Take 1 tablet (250 mg total) by mouth daily. 10 tablet 0   Blood Glucose Monitoring Suppl (ONETOUCH ULTRALINK) w/Device KIT As directed 1 each 3   ciclopirox (PENLAC) 8 % solution Apply topically at bedtime. Apply over nail and surrounding skin. Apply daily over previous coat. After seven (7) days, may remove with alcohol and continue cycle. 6.6 mL 1   clotrimazole-betamethasone (LOTRISONE) cream Apply 1 application topically 2 (two) times daily. 45 g 2   Dapagliflozin-metFORMIN HCl ER (XIGDUO XR) 10-998 MG TB24 Take 1 tablet by mouth 2 (two) times daily. 180 tablet 3   fluticasone (FLONASE) 50 MCG/ACT nasal spray PLACE 2 SPRAY EACH NOSTRIL EVERY DAY 48 mL 3   glucose blood test strip Use as instructed 50 each 12   levocetirizine (XYZAL) 5 MG tablet Take 1 tablet (5 mg total) by mouth every evening. Overdue for follow-up appt must see provider for future refills 30 tablet 0   montelukast (SINGULAIR) 10 MG tablet TAKE 1 TABLET BY MOUTH EVERY DAY 90 tablet 3   multivitamin (ONE-A-DAY MEN'S) TABS tablet Take 1 tablet by mouth daily.     omeprazole (PRILOSEC) 40 MG capsule Patient will need to schedule an office visit. 30 capsule 0   ONE TOUCH  LANCETS MISC As directed 100 each 3   pseudoephedrine (SUDAFED) 120 MG 12 hr tablet Take 1 tablet (120 mg total) by mouth 2 (two) times daily as needed for congestion. 60 tablet 2   telmisartan-hydrochlorothiazide (MICARDIS HCT) 80-25 MG tablet Take 1 tablet by mouth daily. 30 tablet 0   tirzepatide (MOUNJARO) 2.5 MG/0.5ML Pen Inject 2.5 mg into the skin once a week. 2 mL 2   XIGDUO XR 03-999 MG TB24 TAKE 1 TABLET BY MOUTH EVERY DAY 90 tablet 3   rosuvastatin (CRESTOR) 10 MG tablet Take 1 tablet (10 mg total) by mouth daily. (Patient not taking: Reported on 11/05/2021) 90 tablet 3   No facility-administered medications prior to visit.    ROS: Review of Systems  Constitutional:  Negative for appetite change, fatigue and unexpected weight change.  HENT:  Negative for congestion, nosebleeds, sneezing, sore throat and trouble swallowing.   Eyes:  Negative for itching and visual disturbance.  Respiratory:  Negative for cough.   Cardiovascular:  Negative for chest pain, palpitations and leg swelling.  Gastrointestinal:  Negative for abdominal distention, blood in stool, diarrhea and nausea.  Genitourinary:  Negative for frequency and hematuria.  Musculoskeletal:  Negative for back pain, gait problem, joint swelling and neck pain.  Skin:  Negative for rash.  Neurological:  Negative for dizziness, tremors, speech difficulty and weakness.  Psychiatric/Behavioral:  Negative for agitation, dysphoric mood and sleep disturbance. The patient  is not nervous/anxious.    Objective:  BP (!) 144/88 (BP Location: Left Arm, Patient Position: Sitting, Cuff Size: Large)   Pulse 84   Temp 98.7 F (37.1 C) (Oral)   Ht $R'6\' 4"'MX$  (1.93 m)   Wt 280 lb (127 kg)   SpO2 97%   BMI 34.08 kg/m   BP Readings from Last 3 Encounters:  11/11/21 (!) 144/88  11/05/21 (!) 144/90  01/21/21 132/80    Wt Readings from Last 3 Encounters:  11/11/21 280 lb (127 kg)  11/05/21 280 lb (127 kg)  01/21/21 282 lb (127.9 kg)     Physical Exam Constitutional:      General: He is not in acute distress.    Appearance: He is well-developed.     Comments: NAD  Eyes:     Conjunctiva/sclera: Conjunctivae normal.     Pupils: Pupils are equal, round, and reactive to light.  Neck:     Thyroid: No thyromegaly.     Vascular: No JVD.  Cardiovascular:     Rate and Rhythm: Normal rate and regular rhythm.     Heart sounds: Normal heart sounds. No murmur heard.   No friction rub. No gallop.  Pulmonary:     Effort: Pulmonary effort is normal. No respiratory distress.     Breath sounds: Normal breath sounds. No wheezing or rales.  Chest:     Chest wall: No tenderness.  Abdominal:     General: Bowel sounds are normal. There is no distension.     Palpations: Abdomen is soft. There is no mass.     Tenderness: There is no abdominal tenderness. There is no guarding or rebound.  Musculoskeletal:        General: No tenderness. Normal range of motion.     Cervical back: Normal range of motion.  Lymphadenopathy:     Cervical: No cervical adenopathy.  Skin:    General: Skin is warm and dry.     Findings: No rash.  Neurological:     Mental Status: He is alert and oriented to person, place, and time.     Cranial Nerves: No cranial nerve deficit.     Motor: No abnormal muscle tone.     Coordination: Coordination normal.     Gait: Gait normal.     Deep Tendon Reflexes: Reflexes are normal and symmetric.  Psychiatric:        Behavior: Behavior normal.        Thought Content: Thought content normal.        Judgment: Judgment normal.    Lab Results  Component Value Date   WBC 6.4 11/11/2021   HGB 15.6 11/11/2021   HCT 45.9 11/11/2021   PLT 269.0 11/11/2021   GLUCOSE 207 (H) 11/11/2021   CHOL 198 11/11/2021   TRIG 98.0 11/11/2021   HDL 60.60 11/11/2021   LDLCALC 118 (H) 11/11/2021   ALT 43 11/11/2021   AST 24 11/11/2021   NA 139 11/11/2021   K 4.1 11/11/2021   CL 97 11/11/2021   CREATININE 1.35 11/11/2021    BUN 17 11/11/2021   CO2 29 11/11/2021   TSH 1.01 11/11/2021   PSA 0.80 11/11/2021   INR 1.13 04/06/2009   HGBA1C 9.6 (H) 11/11/2021   MICROALBUR 1.2 11/11/2021    No results found.  Assessment & Plan:   Problem List Items Addressed This Visit     HTN (hypertension)   Relevant Medications   amLODipine (NORVASC) 10 MG tablet   telmisartan-hydrochlorothiazide (MICARDIS HCT) 80-25  MG tablet   Hypersomnia with sleep apnea   Relevant Medications   Armodafinil 250 MG tablet   Other Relevant Orders   Iron, TIBC and Ferritin Panel (Completed)   Type II diabetes mellitus with manifestations (HCC)   Relevant Medications   telmisartan-hydrochlorothiazide (MICARDIS HCT) 80-25 MG tablet   tirzepatide (MOUNJARO) 2.5 MG/0.5ML Pen   Dapagliflozin-metFORMIN HCl ER (XIGDUO XR) 03-999 MG TB24   Other Relevant Orders   Hemoglobin A1c (Completed)   Microalbumin / creatinine urine ratio (Completed)   Well adult exam - Primary   Relevant Orders   TSH (Completed)   Urinalysis (Completed)   CBC with Differential/Platelet (Completed)   Lipid panel (Completed)   PSA (Completed)   Comprehensive metabolic panel (Completed)   Iron, TIBC and Ferritin Panel (Completed)   Hemoglobin A1c (Completed)   Microalbumin / creatinine urine ratio (Completed)   Other Visit Diagnoses     Essential hypertension       Relevant Medications   amLODipine (NORVASC) 10 MG tablet   telmisartan-hydrochlorothiazide (MICARDIS HCT) 80-25 MG tablet         Meds ordered this encounter  Medications   amLODipine (NORVASC) 10 MG tablet    Sig: Take 1 tablet (10 mg total) by mouth daily. Overdue for follow-up appt must see provider for future refills    Dispense:  90 tablet    Refill:  3   Armodafinil 250 MG tablet    Sig: Take 1 tablet (250 mg total) by mouth daily.    Dispense:  90 tablet    Refill:  1   fluticasone (FLONASE) 50 MCG/ACT nasal spray    Sig: PLACE 2 SPRAY'S EACH NOSTRIL EVERY DAY    Dispense:   48 mL    Refill:  3   levocetirizine (XYZAL) 5 MG tablet    Sig: Take 1 tablet (5 mg total) by mouth every evening. Overdue for follow-up appt must see provider for future refills    Dispense:  90 tablet    Refill:  3   montelukast (SINGULAIR) 10 MG tablet    Sig: Take 1 tablet (10 mg total) by mouth daily.    Dispense:  90 tablet    Refill:  3   multivitamin (ONE-A-DAY MEN'S) TABS tablet    Sig: Take 1 tablet by mouth daily.    Dispense:  90 tablet    Refill:  3   omeprazole (PRILOSEC) 40 MG capsule    Sig: Patient will need to schedule an office visit.    Dispense:  90 capsule    Refill:  3   pseudoephedrine (SUDAFED) 120 MG 12 hr tablet    Sig: Take 1 tablet (120 mg total) by mouth 2 (two) times daily as needed for congestion.    Dispense:  60 tablet    Refill:  2   telmisartan-hydrochlorothiazide (MICARDIS HCT) 80-25 MG tablet    Sig: Take 1 tablet by mouth daily.    Dispense:  90 tablet    Refill:  3   tirzepatide (MOUNJARO) 2.5 MG/0.5ML Pen    Sig: Inject 2.5 mg into the skin once a week.    Dispense:  2 mL    Refill:  2   Dapagliflozin-metFORMIN HCl ER (XIGDUO XR) 03-999 MG TB24    Sig: Take 1 tablet by mouth daily.    Dispense:  90 tablet    Refill:  3      Follow-up: Return in about 3 months (around 02/11/2022) for Wellness Exam.  Cristie Hem  Cyle Kenyon, MD

## 2021-11-12 LAB — IRON,TIBC AND FERRITIN PANEL
%SAT: 27 % (calc) (ref 20–48)
Ferritin: 301 ng/mL (ref 38–380)
Iron: 82 ug/dL (ref 50–180)
TIBC: 301 mcg/dL (calc) (ref 250–425)

## 2021-11-12 NOTE — Telephone Encounter (Signed)
CHECK cover-my-meds rec'd msg stating " This request has received a denial." We used plan approved criteria when making this decision. The policy states that this medication may be approved when: -The member is unable to take the required number of formulary alternatives for the given diagnosis due to an intolerance or contraindication Pt must try : Ozempic, Rybelsus, Trulicity, Victoza.Marland Kitchenlmb

## 2021-11-13 ENCOUNTER — Encounter: Payer: Self-pay | Admitting: Internal Medicine

## 2021-11-13 MED ORDER — RYBELSUS 3 MG PO TABS: 3.0000 mg | ORAL_TABLET | Freq: Every day | ORAL | 3 refills | Status: DC

## 2021-11-13 NOTE — Assessment & Plan Note (Signed)
Darcel Bayley is not covered.  I will start Rybelsus instead.

## 2021-11-13 NOTE — Addendum Note (Signed)
Addended by: Cassandria Anger on: 11/13/2021 05:33 PM   Modules accepted: Orders

## 2021-11-13 NOTE — Telephone Encounter (Signed)
Noted.  I discontinued Mounjaro.  Prescription for Rybelsus 3 mg every day was sent to the pharmacy.  Thanks

## 2021-11-19 ENCOUNTER — Telehealth: Payer: Self-pay | Admitting: *Deleted

## 2021-11-19 NOTE — Telephone Encounter (Signed)
Rec'd fax it states alternative is needed. Plan limit exceeded...Robert Bridges

## 2021-11-22 NOTE — Telephone Encounter (Signed)
Sorry, It on pt Armodafinil 250 MG tablet...Robert Bridges

## 2021-11-22 NOTE — Telephone Encounter (Signed)
What medication?  Thank you

## 2021-11-23 ENCOUNTER — Telehealth: Payer: Self-pay | Admitting: *Deleted

## 2021-11-23 MED ORDER — MODAFINIL 200 MG PO TABS: 200.0000 mg | ORAL_TABLET | Freq: Every day | ORAL | 1 refills | Status: DC

## 2021-11-23 NOTE — Telephone Encounter (Signed)
Modafinil prescription was sent instead.  Thanks

## 2021-11-23 NOTE — Telephone Encounter (Signed)
Resubmitted PA w/ (Key: [id number]). Rec'd msg stating " Your information has been submitted to [place]. To check for an updated outcome later, reopen this PA request from your dashboard".Marland KitchenJohny Chess

## 2021-11-23 NOTE — Telephone Encounter (Signed)
Pt was on cover-my-meds he needing PA on Modafinil '200MG'$  tablets. Submitted PA via cover-my-meds w. Key: [id number]. Rec'd msg [facility] is not able to process this request through [place], please contact the plan at [phone] or fax in request to [phone].Marland KitchenMarland KitchenCalled number given per [facility] [employer] not open. Will try [employer] again.Marland KitchenJohny Chess

## 2021-11-24 NOTE — Telephone Encounter (Signed)
Rec'd determination fax med was APPROVED effective 11/23/2021 - 11/24/2022. Faxed approval to pharmacy...Johny Chess

## 2021-12-23 ENCOUNTER — Encounter: Payer: Self-pay | Admitting: Internal Medicine

## 2021-12-27 NOTE — Telephone Encounter (Signed)
Emailed rx to [email or web address] att: Raquel Sarna.Marland KitchenJohny Chess

## 2022-01-13 ENCOUNTER — Ambulatory Visit: Payer: BC Managed Care – PPO | Admitting: Internal Medicine

## 2022-04-02 ENCOUNTER — Other Ambulatory Visit: Payer: Self-pay | Admitting: Internal Medicine

## 2022-07-17 ENCOUNTER — Other Ambulatory Visit: Payer: Self-pay | Admitting: Internal Medicine

## 2022-10-22 ENCOUNTER — Other Ambulatory Visit: Payer: Self-pay | Admitting: Internal Medicine

## 2022-10-26 ENCOUNTER — Telehealth: Payer: Self-pay | Admitting: *Deleted

## 2022-10-26 NOTE — Telephone Encounter (Signed)
Pt need PA on Modafinil 200 mg. Submitted w/ [id number]. PA request has been sent to [facility].Marland Kitchen/LMB

## 2022-10-27 NOTE — Telephone Encounter (Signed)
Rec'd determination back. Your PA request has been approved. Authorization Expiration Date: 10/25/2023. Faxed approval to [facility].Marland KitchenShearon Stalls

## 2022-11-18 ENCOUNTER — Ambulatory Visit (INDEPENDENT_AMBULATORY_CARE_PROVIDER_SITE_OTHER): Payer: BC Managed Care – PPO | Admitting: Internal Medicine

## 2022-11-18 ENCOUNTER — Encounter: Payer: Self-pay | Admitting: Internal Medicine

## 2022-11-18 VITALS — BP 132/84 | HR 95 | Temp 98.5°F | Ht 76.0 in | Wt 276.0 lb

## 2022-11-18 DIAGNOSIS — G4733 Obstructive sleep apnea (adult) (pediatric): Secondary | ICD-10-CM | POA: Diagnosis not present

## 2022-11-18 DIAGNOSIS — E538 Deficiency of other specified B group vitamins: Secondary | ICD-10-CM

## 2022-11-18 DIAGNOSIS — E118 Type 2 diabetes mellitus with unspecified complications: Secondary | ICD-10-CM | POA: Diagnosis not present

## 2022-11-18 DIAGNOSIS — Z0000 Encounter for general adult medical examination without abnormal findings: Secondary | ICD-10-CM | POA: Diagnosis not present

## 2022-11-18 DIAGNOSIS — E559 Vitamin D deficiency, unspecified: Secondary | ICD-10-CM | POA: Diagnosis not present

## 2022-11-18 DIAGNOSIS — E291 Testicular hypofunction: Secondary | ICD-10-CM | POA: Diagnosis not present

## 2022-11-18 DIAGNOSIS — I10 Essential (primary) hypertension: Secondary | ICD-10-CM | POA: Diagnosis not present

## 2022-11-18 LAB — TSH: TSH: 1.08 u[IU]/mL (ref 0.35–5.50)

## 2022-11-18 LAB — CBC WITH DIFFERENTIAL/PLATELET
Basophils Absolute: 0.1 10*3/uL (ref 0.0–0.1)
Basophils Relative: 0.8 % (ref 0.0–3.0)
Eosinophils Absolute: 0.2 10*3/uL (ref 0.0–0.7)
Eosinophils Relative: 2.5 % (ref 0.0–5.0)
HCT: 48 % (ref 39.0–52.0)
Hemoglobin: 15.8 g/dL (ref 13.0–17.0)
Lymphocytes Relative: 32.5 % (ref 12.0–46.0)
Lymphs Abs: 2.2 10*3/uL (ref 0.7–4.0)
MCHC: 32.9 g/dL (ref 30.0–36.0)
MCV: 88.6 fl (ref 78.0–100.0)
Monocytes Absolute: 0.3 10*3/uL (ref 0.1–1.0)
Monocytes Relative: 4 % (ref 3.0–12.0)
Neutro Abs: 4 10*3/uL (ref 1.4–7.7)
Neutrophils Relative %: 60.2 % (ref 43.0–77.0)
Platelets: 265 10*3/uL (ref 150.0–400.0)
RBC: 5.42 Mil/uL (ref 4.22–5.81)
RDW: 13.5 % (ref 11.5–15.5)
WBC: 6.6 10*3/uL (ref 4.0–10.5)

## 2022-11-18 LAB — COMPREHENSIVE METABOLIC PANEL
ALT: 32 U/L (ref 0–53)
AST: 19 U/L (ref 0–37)
Albumin: 4.7 g/dL (ref 3.5–5.2)
Alkaline Phosphatase: 70 U/L (ref 39–117)
BUN: 14 mg/dL (ref 6–23)
CO2: 29 mEq/L (ref 19–32)
Calcium: 10.2 mg/dL (ref 8.4–10.5)
Chloride: 100 mEq/L (ref 96–112)
Creatinine, Ser: 1.33 mg/dL (ref 0.40–1.50)
GFR: 63.5 mL/min (ref >60.00)
Glucose, Bld: 199 mg/dL — ABNORMAL HIGH (ref 70–99)
Potassium: 3.8 mEq/L (ref 3.5–5.1)
Sodium: 141 mEq/L (ref 135–145)
Total Bilirubin: 0.7 mg/dL (ref 0.2–1.2)
Total Protein: 7.9 g/dL (ref 6.0–8.3)

## 2022-11-18 LAB — VITAMIN D 25 HYDROXY (VIT D DEFICIENCY, FRACTURES): VITD: 39.3 ng/mL (ref 30.00–100.00)

## 2022-11-18 LAB — URINALYSIS
Bilirubin Urine: NEGATIVE
Hgb urine dipstick: NEGATIVE
Ketones, ur: NEGATIVE
Leukocytes,Ua: NEGATIVE
Nitrite: NEGATIVE
Specific Gravity, Urine: 1.015 (ref 1.000–1.030)
Total Protein, Urine: NEGATIVE
Urine Glucose: >=1000 — AB
Urobilinogen, UA: 0.2 (ref 0.0–1.0)
pH: 5.5 (ref 5.0–8.0)

## 2022-11-18 LAB — VITAMIN B12: Vitamin B-12: 636 pg/mL (ref 211–911)

## 2022-11-18 LAB — LIPID PANEL
Cholesterol: 176 mg/dL (ref 0–200)
HDL: 60.9 mg/dL (ref >39.00)
LDL Cholesterol: 92 mg/dL (ref 0–99)
NonHDL: 114.95
Total CHOL/HDL Ratio: 3
Triglycerides: 114 mg/dL (ref 0.0–149.0)
VLDL: 22.8 mg/dL (ref 0.0–40.0)

## 2022-11-18 LAB — HEMOGLOBIN A1C: Hgb A1c MFr Bld: 10.7 % — ABNORMAL HIGH (ref 4.6–6.5)

## 2022-11-18 LAB — MICROALBUMIN / CREATININE URINE RATIO
Creatinine,U: 56 mg/dL
Microalb Creat Ratio: 3.1 mg/g (ref 0.0–30.0)
Microalb, Ur: 1.8 mg/dL (ref 0.0–1.9)

## 2022-11-18 LAB — PSA: PSA: 1.01 ng/mL (ref 0.10–4.00)

## 2022-11-18 LAB — TESTOSTERONE: Testosterone: 284.48 ng/dL — ABNORMAL LOW (ref 300.00–890.00)

## 2022-11-18 MED ORDER — ARMODAFINIL 200 MG PO TABS: ORAL_TABLET | ORAL | 1 refills | Status: DC

## 2022-11-18 NOTE — Progress Notes (Signed)
Subjective:  Patient ID: Robert Gip Sr., male    DOB: 1975/11/18  Age: 47 y.o. MRN: [id number]  CC: Annual Exam (Cpap machine has been leaking needs a new one.)   HPI Robert Gip Sr. presents for HTN, OSA, DM, vitamin deficiency follow-up  Outpatient Medications Prior to Visit  Medication Sig Dispense Refill  . amLODipine (NORVASC) 10 MG tablet Take 1 tablet (10 mg total) by mouth daily. Overdue for follow-up appt must see provider for future refills 90 tablet 3  . Cholecalciferol (VITAMIN D3) 2000 units capsule Take 1 capsule (2,000 Units total) by mouth daily. 100 capsule 3  . Dapagliflozin-metFORMIN HCl ER (XIGDUO XR) 03-999 MG TB24 Take 1 tablet by mouth daily. 90 tablet 3  . Ferrous Sulfate (IRON SUPPLEMENT PO) Take 1 each by mouth daily.    . fluticasone (FLONASE) 50 MCG/ACT nasal spray PLACE 2 SPRAY'S EACH NOSTRIL EVERY DAY 48 mL 3  . hydrocortisone-pramoxine (PROCTOFOAM-HC) rectal foam Place 1 applicator rectally 2 (two) times daily. As needed.    Marland Kitchen levocetirizine (XYZAL) 5 MG tablet Take 1 tablet (5 mg total) by mouth every evening. Overdue for follow-up appt must see provider for future refills 90 tablet 3  . montelukast (SINGULAIR) 10 MG tablet Take 1 tablet (10 mg total) by mouth daily. 90 tablet 3  . multivitamin (ONE-A-DAY MEN'S) TABS tablet Take 1 tablet by mouth daily. 90 tablet 3  . omeprazole (PRILOSEC) 40 MG capsule Patient will need to schedule an office visit. 90 capsule 3  . Semaglutide (RYBELSUS) 3 MG TABS Take 1 tablet (3 mg total) by mouth daily. Annual appt due in June must see provider for future refills 30 tablet 0  . telmisartan-hydrochlorothiazide (MICARDIS HCT) 80-25 MG tablet Take 1 tablet by mouth daily. 90 tablet 3  . modafinil (PROVIGIL) 200 MG tablet TAKE 1 TABLET BY MOUTH EVERY DAY 90 tablet 0   No facility-administered medications prior to visit.    ROS: Review of Systems  Constitutional:  Negative for appetite change, fatigue and  unexpected weight change.  HENT:  Negative for congestion, nosebleeds, sneezing, sore throat and trouble swallowing.   Eyes:  Negative for itching and visual disturbance.  Respiratory:  Negative for cough.   Cardiovascular:  Negative for chest pain, palpitations and leg swelling.  Gastrointestinal:  Negative for abdominal distention, blood in stool, diarrhea and nausea.  Genitourinary:  Negative for frequency and hematuria.  Musculoskeletal:  Negative for back pain, gait problem, joint swelling and neck pain.  Skin:  Negative for rash.  Neurological:  Negative for dizziness, tremors, speech difficulty and weakness.  Psychiatric/Behavioral:  Negative for agitation, dysphoric mood and sleep disturbance. The patient is not nervous/anxious.     Objective:  BP 132/84 (BP Location: Left Arm, Patient Position: Sitting, Cuff Size: Normal)   Pulse 95   Temp 98.5 F (36.9 C) (Oral)   Ht 6\' 4"  (1.93 m)   Wt 276 lb (125.2 kg)   SpO2 97%   BMI 33.60 kg/m   BP Readings from Last 3 Encounters:  11/18/22 132/84  11/11/21 (!) 144/88  11/05/21 (!) 144/90    Wt Readings from Last 3 Encounters:  11/18/22 276 lb (125.2 kg)  11/11/21 280 lb (127 kg)  11/05/21 280 lb (127 kg)    Physical Exam Constitutional:      General: He is not in acute distress.    Appearance: He is well-developed.     Comments: NAD  Eyes:     Conjunctiva/sclera: Conjunctivae normal.  Pupils: Pupils are equal, round, and reactive to light.  Neck:     Thyroid: No thyromegaly.     Vascular: No JVD.  Cardiovascular:     Rate and Rhythm: Normal rate and regular rhythm.     Heart sounds: Normal heart sounds. No murmur heard.    No friction rub. No gallop.  Pulmonary:     Effort: Pulmonary effort is normal. No respiratory distress.     Breath sounds: Normal breath sounds. No wheezing or rales.  Chest:     Chest wall: No tenderness.  Abdominal:     General: Bowel sounds are normal. There is no distension.      Palpations: Abdomen is soft. There is no mass.     Tenderness: There is no abdominal tenderness. There is no guarding or rebound.  Musculoskeletal:        General: No tenderness. Normal range of motion.     Cervical back: Normal range of motion.  Lymphadenopathy:     Cervical: No cervical adenopathy.  Skin:    General: Skin is warm and dry.     Findings: No rash.  Neurological:     Mental Status: He is alert and oriented to person, place, and time.     Cranial Nerves: No cranial nerve deficit.     Motor: No abnormal muscle tone.     Coordination: Coordination normal.     Gait: Gait normal.     Deep Tendon Reflexes: Reflexes are normal and symmetric.  Psychiatric:        Behavior: Behavior normal.        Thought Content: Thought content normal.        Judgment: Judgment normal.    Lab Results  Component Value Date   WBC 6.4 11/11/2021   HGB 15.6 11/11/2021   HCT 45.9 11/11/2021   PLT 269.0 11/11/2021   GLUCOSE 207 (H) 11/11/2021   CHOL 198 11/11/2021   TRIG 98.0 11/11/2021   HDL 60.60 11/11/2021   LDLCALC 118 (H) 11/11/2021   ALT 43 11/11/2021   AST 24 11/11/2021   NA 139 11/11/2021   K 4.1 11/11/2021   CL 97 11/11/2021   CREATININE 1.35 11/11/2021   BUN 17 11/11/2021   CO2 29 11/11/2021   TSH 1.01 11/11/2021   PSA 0.80 11/11/2021   INR 1.13 04/06/2009   HGBA1C 9.6 (H) 11/11/2021   MICROALBUR 1.2 11/11/2021    No results found.  Assessment & Plan:   Problem List Items Addressed This Visit     Hypogonadism in male    Labs      Relevant Orders   Testosterone   B12 deficiency - Primary    Continue with vitamin B12 treatment      Relevant Orders   Vitamin B12   Vitamin D deficiency   Relevant Orders   VITAMIN D 25 Hydroxy (Vit-D Deficiency, Fractures)   OSA on CPAP    On CPAP      Well adult exam   Relevant Orders   TSH   Urinalysis   CBC with Differential/Platelet   Lipid panel   PSA   Comprehensive metabolic panel   HTN (hypertension)      On Micardis      Type II diabetes mellitus with manifestations (HCC)   Relevant Orders   TSH   Urinalysis   CBC with Differential/Platelet   Lipid panel   PSA   Comprehensive metabolic panel   Testosterone   Microalbumin / creatinine urine ratio  Hemoglobin A1c      Meds ordered this encounter  Medications  . Armodafinil 200 MG TABS    Sig: 1 po qd    Dispense:  90 tablet    Refill:  1      Follow-up: No follow-ups on file.  Sonda Primes, MD

## 2022-11-18 NOTE — Assessment & Plan Note (Signed)
Continue with vitamin B12 treatment 

## 2022-11-18 NOTE — Assessment & Plan Note (Addendum)
On CPAP Prescription for new machine/supplies given Provigil was replaced for Nuvigil due to insurance coverage

## 2022-11-18 NOTE — Assessment & Plan Note (Signed)
On Micardis 

## 2022-11-18 NOTE — Assessment & Plan Note (Signed)
Labs

## 2022-11-20 ENCOUNTER — Encounter: Payer: Self-pay | Admitting: Internal Medicine

## 2022-11-21 ENCOUNTER — Other Ambulatory Visit (HOSPITAL_COMMUNITY): Payer: Self-pay

## 2022-11-21 ENCOUNTER — Telehealth: Payer: Self-pay

## 2022-11-21 NOTE — Telephone Encounter (Signed)
Pt takes Armodafinil 200 MG TABS .Marland KitchenRaechel Chute

## 2022-11-21 NOTE — Telephone Encounter (Signed)
Did patient fail Modafinil? A PA was done for this med and it has been approved.

## 2022-11-21 NOTE — Telephone Encounter (Signed)
*  Primary  PA request received for Armodafinil 200MG  tablets  Pending correct med patient is to be on  Key: [id number]

## 2022-11-22 ENCOUNTER — Other Ambulatory Visit: Payer: Self-pay | Admitting: Internal Medicine

## 2022-11-22 DIAGNOSIS — E118 Type 2 diabetes mellitus with unspecified complications: Secondary | ICD-10-CM

## 2022-11-22 MED ORDER — RYBELSUS 7 MG PO TABS: 1.0000 | ORAL_TABLET | Freq: Every day | ORAL | 2 refills | Status: DC

## 2022-11-22 NOTE — Telephone Encounter (Signed)
Pt never taken Modafinil he states he has always been on Armodafinil 200 MG TABS ../l;mb

## 2022-11-23 ENCOUNTER — Other Ambulatory Visit: Payer: Self-pay | Admitting: Internal Medicine

## 2022-11-23 NOTE — Telephone Encounter (Signed)
Armodafinil is not covered which is why the PA for Modafinil was done.

## 2022-11-25 NOTE — Telephone Encounter (Signed)
What can he have?  What medication was approved?  Thanks

## 2022-11-28 ENCOUNTER — Other Ambulatory Visit: Payer: Self-pay | Admitting: Internal Medicine

## 2022-11-28 MED ORDER — MODAFINIL 200 MG PO TABS: 200.0000 mg | ORAL_TABLET | Freq: Every day | ORAL | 1 refills | Status: DC

## 2022-11-28 MED ORDER — TIRZEPATIDE 2.5 MG/0.5ML ~~LOC~~ SOAJ: 2.5000 mg | SUBCUTANEOUS | 2 refills | Status: DC

## 2022-11-28 NOTE — Telephone Encounter (Signed)
Okay.  Done.  Thanks 

## 2022-11-28 NOTE — Telephone Encounter (Signed)
Insurance not covering Armodafinil, but will cover Modafinil . Will need rx sent to pharmacy.Marland Kitchen/l,mb

## 2022-11-29 NOTE — Telephone Encounter (Signed)
MD sent Modafinil to [facility]...Raechel Chute

## 2022-12-10 ENCOUNTER — Other Ambulatory Visit: Payer: Self-pay | Admitting: Internal Medicine

## 2022-12-12 ENCOUNTER — Other Ambulatory Visit: Payer: Self-pay | Admitting: Internal Medicine

## 2022-12-12 MED ORDER — RYBELSUS 7 MG PO TABS: 1.0000 | ORAL_TABLET | Freq: Every day | ORAL | 1 refills | Status: DC

## 2023-02-08 ENCOUNTER — Other Ambulatory Visit: Payer: Self-pay | Admitting: Internal Medicine

## 2023-02-08 DIAGNOSIS — I10 Essential (primary) hypertension: Secondary | ICD-10-CM

## 2023-02-09 ENCOUNTER — Other Ambulatory Visit: Payer: Self-pay | Admitting: Internal Medicine

## 2023-02-09 DIAGNOSIS — E118 Type 2 diabetes mellitus with unspecified complications: Secondary | ICD-10-CM

## 2023-02-27 ENCOUNTER — Telehealth: Payer: Self-pay

## 2023-02-27 ENCOUNTER — Other Ambulatory Visit (HOSPITAL_COMMUNITY): Payer: Self-pay

## 2023-02-27 NOTE — Telephone Encounter (Signed)
Pharmacy Patient Advocate Encounter   Received notification from CoverMyMeds that prior authorization for RYBELSUS 7MG  is required/requested.   Insurance verification completed.   The patient is insured through [facility] .   Per test claim: PA required; PA submitted to [facility] via CoverMyMeds Key/confirmation #/EOC [id number] Status is pending

## 2023-02-28 NOTE — Telephone Encounter (Signed)
Pharmacy Patient Advocate Encounter  Received notification from [facility] that Prior Authorization for Rybelsus 7MG  has been APPROVED from 02/27/23 to 02/26/26   PA #/Case ID/Reference #: [id number]

## 2023-04-07 ENCOUNTER — Encounter: Payer: Self-pay | Admitting: Internal Medicine

## 2023-04-07 ENCOUNTER — Ambulatory Visit (INDEPENDENT_AMBULATORY_CARE_PROVIDER_SITE_OTHER): Payer: BC Managed Care – PPO | Admitting: Internal Medicine

## 2023-04-07 VITALS — BP 132/74 | HR 95 | Temp 98.4°F | Ht 76.0 in | Wt 276.2 lb

## 2023-04-07 DIAGNOSIS — G8929 Other chronic pain: Secondary | ICD-10-CM

## 2023-04-07 DIAGNOSIS — M25562 Pain in left knee: Secondary | ICD-10-CM

## 2023-04-07 DIAGNOSIS — Z7984 Long term (current) use of oral hypoglycemic drugs: Secondary | ICD-10-CM | POA: Diagnosis not present

## 2023-04-07 DIAGNOSIS — M25561 Pain in right knee: Secondary | ICD-10-CM

## 2023-04-07 DIAGNOSIS — E118 Type 2 diabetes mellitus with unspecified complications: Secondary | ICD-10-CM

## 2023-04-07 DIAGNOSIS — Z1211 Encounter for screening for malignant neoplasm of colon: Secondary | ICD-10-CM

## 2023-04-07 LAB — COMPREHENSIVE METABOLIC PANEL
ALT: 32 U/L (ref 0–53)
AST: 18 U/L (ref 0–37)
Albumin: 4.7 g/dL (ref 3.5–5.2)
Alkaline Phosphatase: 62 U/L (ref 39–117)
BUN: 19 mg/dL (ref 6–23)
CO2: 29 meq/L (ref 19–32)
Calcium: 10.1 mg/dL (ref 8.4–10.5)
Chloride: 101 meq/L (ref 96–112)
Creatinine, Ser: 1.42 mg/dL (ref 0.40–1.50)
GFR: 58.55 mL/min — ABNORMAL LOW (ref >60.00)
Glucose, Bld: 227 mg/dL — ABNORMAL HIGH (ref 70–99)
Potassium: 3.5 meq/L (ref 3.5–5.1)
Sodium: 140 meq/L (ref 135–145)
Total Bilirubin: 0.6 mg/dL (ref 0.2–1.2)
Total Protein: 7.8 g/dL (ref 6.0–8.3)

## 2023-04-07 LAB — HEMOGLOBIN A1C: Hgb A1c MFr Bld: 10.5 % — ABNORMAL HIGH (ref 4.6–6.5)

## 2023-04-07 MED ORDER — METHYLPREDNISOLONE ACETATE 40 MG/ML IJ SUSP
40.0000 mg | Freq: Once | INTRAMUSCULAR | Status: AC
Start: 2023-04-07 — End: 2023-04-07
  Administered 2023-04-07: 40 mg via INTRA_ARTICULAR

## 2023-04-07 MED ORDER — RYBELSUS 14 MG PO TABS: 14.0000 mg | ORAL_TABLET | Freq: Every day | ORAL | 11 refills | Status: DC

## 2023-04-07 MED ORDER — MELOXICAM 15 MG PO TABS: 15.0000 mg | ORAL_TABLET | Freq: Every day | ORAL | 2 refills | Status: DC | PRN

## 2023-04-07 NOTE — Progress Notes (Signed)
Subjective:  Patient ID: Robert Gip Sr., male    DOB: 03/13/1976  Age: 47 y.o. MRN: [id number]  CC: Medical Management of Chronic Issues (Patient states he is having some pain in both knees, patient is requesting a cortisone shot.//He also want lab results  )   HPI Robert Gip Sr. presents for B knee pain  Outpatient Medications Prior to Visit  Medication Sig Dispense Refill   amLODipine (NORVASC) 10 MG tablet TAKE 1 TABLET BY MOUTH DAILY. OVERDUE FOR FOLLOW-UP APPT MUST SEE PROVIDER FOR FUTURE REFILLS 90 tablet 3   fluticasone (FLONASE) 50 MCG/ACT nasal spray PLACE 2 SPRAY'S EACH NOSTRIL EVERY DAY 48 mL 3   hydrocortisone-pramoxine (PROCTOFOAM-HC) rectal foam Place 1 applicator rectally 2 (two) times daily. As needed.     levocetirizine (XYZAL) 5 MG tablet Take 1 tablet (5 mg total) by mouth every evening. Overdue for follow-up appt must see provider for future refills 90 tablet 3   modafinil (PROVIGIL) 200 MG tablet Take 1 tablet (200 mg total) by mouth daily. 90 tablet 1   montelukast (SINGULAIR) 10 MG tablet TAKE 1 TABLET BY MOUTH EVERY DAY 90 tablet 3   omeprazole (PRILOSEC) 40 MG capsule PATIENT WILL NEED TO SCHEDULE AN OFFICE VISIT. 90 capsule 3   telmisartan-hydrochlorothiazide (MICARDIS HCT) 80-25 MG tablet TAKE 1 TABLET BY MOUTH EVERY DAY 90 tablet 3   XIGDUO XR 03-999 MG TB24 TAKE 1 TABLET BY MOUTH EVERY DAY 90 tablet 3   Semaglutide (RYBELSUS) 7 MG TABS Take 1 tablet (7 mg total) by mouth daily. Take 30 min ac 30 tablet 1   Cholecalciferol (VITAMIN D3) 2000 units capsule Take 1 capsule (2,000 Units total) by mouth daily. 100 capsule 3   Ferrous Sulfate (IRON SUPPLEMENT PO) Take 1 each by mouth daily.     multivitamin (ONE-A-DAY MEN'S) TABS tablet Take 1 tablet by mouth daily. 90 tablet 3   No facility-administered medications prior to visit.    ROS: Review of Systems  Constitutional:  Negative for appetite change, fatigue and unexpected weight change.  HENT:   Negative for congestion, nosebleeds, sneezing, sore throat and trouble swallowing.   Eyes:  Negative for itching and visual disturbance.  Respiratory:  Negative for cough.   Cardiovascular:  Negative for chest pain, palpitations and leg swelling.  Gastrointestinal:  Negative for abdominal distention, blood in stool, diarrhea and nausea.  Genitourinary:  Negative for frequency and hematuria.  Musculoskeletal:  Positive for arthralgias and gait problem. Negative for back pain, joint swelling and neck pain.  Skin:  Negative for rash.  Neurological:  Negative for dizziness, tremors, speech difficulty and weakness.  Psychiatric/Behavioral:  Negative for agitation, dysphoric mood and sleep disturbance. The patient is not nervous/anxious.     Objective:  BP 132/74   Pulse 95   Temp 98.4 F (36.9 C) (Oral)   Ht 6\' 4"  (1.93 m)   Wt 276 lb 4 oz (125.3 kg)   SpO2 92%   BMI 33.63 kg/m   BP Readings from Last 3 Encounters:  04/07/23 132/74  11/18/22 132/84  11/11/21 (!) 144/88    Wt Readings from Last 3 Encounters:  04/07/23 276 lb 4 oz (125.3 kg)  11/18/22 276 lb (125.2 kg)  11/11/21 280 lb (127 kg)    Physical Exam Constitutional:      General: He is not in acute distress.    Appearance: He is well-developed.     Comments: NAD  Eyes:     Conjunctiva/sclera: Conjunctivae normal.  Pupils: Pupils are equal, round, and reactive to light.  Neck:     Thyroid: No thyromegaly.     Vascular: No JVD.  Cardiovascular:     Rate and Rhythm: Normal rate and regular rhythm.     Heart sounds: Normal heart sounds. No murmur heard.    No friction rub. No gallop.  Pulmonary:     Effort: Pulmonary effort is normal. No respiratory distress.     Breath sounds: Normal breath sounds. No wheezing or rales.  Chest:     Chest wall: No tenderness.  Abdominal:     General: Bowel sounds are normal. There is no distension.     Palpations: Abdomen is soft. There is no mass.     Tenderness: There is  no abdominal tenderness. There is no guarding or rebound.  Musculoskeletal:        General: Tenderness present. Normal range of motion.     Cervical back: Normal range of motion.     Right lower leg: No edema.     Left lower leg: No edema.  Lymphadenopathy:     Cervical: No cervical adenopathy.  Skin:    General: Skin is warm and dry.     Findings: No rash.  Neurological:     Mental Status: He is alert and oriented to person, place, and time.     Cranial Nerves: No cranial nerve deficit.     Motor: No abnormal muscle tone.     Coordination: Coordination normal.     Gait: Gait normal.     Deep Tendon Reflexes: Reflexes are normal and symmetric.  Psychiatric:        Behavior: Behavior normal.        Thought Content: Thought content normal.        Judgment: Judgment normal.   B knees w/pain on ROM   Procedure Note :     Procedure : Joint Injection, R and L   knee   Indication:  Joint osteoarthritis with refractory  chronic pain.   Risks including unsuccessful procedure , bleeding, infection, bruising, skin atrophy, "steroid flare-up" and others were explained to the patient in detail as well as the benefits. Informed consent was obtained verbally.  Tthe patient was placed in a comfortable position. Lateral approach was used. Skin was prepped with Betadine and alcohol  and anesthetized a cooling spray. Then, a 5 cc syringe with a 1.5 inch long 25-gauge needle was used for a joint injection.. The needle was advanced  Into each knee joint cavity. I aspirated a small amount of intra-articular fluid to confirm correct placement of the needle and injected the joint with 5 mL of 2% lidocaine and 40 mg of Depo-Medrol each .  Band-Aids applied.   Tolerated well. Complications: None. Good pain relief following the procedure.   Postprocedure instructions :    A Band-Aid should be left on for 12 hours. Injection therapy is not a cure itself. It is used in conjunction with other modalities.  You can use nonsteroidal anti-inflammatories like ibuprofen , hot and cold compresses. Rest is recommended in the next 24 hours. You need to report immediately  if fever, chills or any signs of infection develop.    Lab Results  Component Value Date   WBC 6.6 11/18/2022   HGB 15.8 11/18/2022   HCT 48.0 11/18/2022   PLT 265.0 11/18/2022   GLUCOSE 199 (H) 11/18/2022   CHOL 176 11/18/2022   TRIG 114.0 11/18/2022   HDL 60.90 11/18/2022  LDLCALC 92 11/18/2022   ALT 32 11/18/2022   AST 19 11/18/2022   NA 141 11/18/2022   K 3.8 11/18/2022   CL 100 11/18/2022   CREATININE 1.33 11/18/2022   BUN 14 11/18/2022   CO2 29 11/18/2022   TSH 1.08 11/18/2022   PSA 1.01 11/18/2022   INR 1.13 04/06/2009   HGBA1C 10.7 (H) 11/18/2022   MICROALBUR 1.8 11/18/2022    No results found.  Assessment & Plan:   Problem List Items Addressed This Visit     Type II diabetes mellitus with manifestations (HCC)    Increase Rybelsus 14 mg/d      Relevant Medications   Semaglutide (RYBELSUS) 14 MG TABS   Knee pain, bilateral - Primary    Options discussed Pt asked me to inject      Other Visit Diagnoses     Screening for colon cancer       Relevant Orders   Cologuard         Meds ordered this encounter  Medications   Semaglutide (RYBELSUS) 14 MG TABS    Sig: Take 1 tablet (14 mg total) by mouth daily.    Dispense:  30 tablet    Refill:  11      Follow-up: No follow-ups on file.  Sonda Primes, MD

## 2023-04-07 NOTE — Assessment & Plan Note (Signed)
Increase Rybelsus 14 mg/d

## 2023-04-07 NOTE — Assessment & Plan Note (Signed)
Options discussed Pt asked me to inject

## 2023-04-11 ENCOUNTER — Other Ambulatory Visit: Payer: Self-pay | Admitting: Internal Medicine

## 2023-04-11 MED ORDER — REPAGLINIDE 1 MG PO TABS: 1.0000 mg | ORAL_TABLET | Freq: Three times a day (TID) | ORAL | 11 refills | Status: DC

## 2023-04-16 ENCOUNTER — Other Ambulatory Visit: Payer: Self-pay | Admitting: Internal Medicine

## 2023-06-12 ENCOUNTER — Other Ambulatory Visit: Payer: Self-pay | Admitting: Internal Medicine

## 2023-06-26 ENCOUNTER — Other Ambulatory Visit: Payer: Self-pay | Admitting: Internal Medicine

## 2023-07-05 ENCOUNTER — Other Ambulatory Visit: Payer: Self-pay | Admitting: Internal Medicine

## 2023-07-16 LAB — COLOGUARD: COLOGUARD: NEGATIVE

## 2023-08-20 ENCOUNTER — Other Ambulatory Visit: Payer: Self-pay | Admitting: Internal Medicine

## 2023-10-21 ENCOUNTER — Other Ambulatory Visit: Payer: Self-pay | Admitting: Internal Medicine

## 2023-10-30 ENCOUNTER — Encounter: Payer: Self-pay | Admitting: Internal Medicine

## 2023-11-23 ENCOUNTER — Telehealth: Payer: Self-pay

## 2023-11-23 NOTE — Telephone Encounter (Signed)
 Pharmacy Patient Advocate Encounter   Received notification from CoverMyMeds that prior authorization for Modafinil  200MG  tablets is required/requested.   Insurance verification completed.   The patient is insured through [facility] .   Per test claim: PA required; PA submitted to above mentioned insurance via CoverMyMeds Key/confirmation #/EOC [id number] Status is pending

## 2023-11-24 ENCOUNTER — Other Ambulatory Visit (HOSPITAL_COMMUNITY): Payer: Self-pay

## 2023-11-24 NOTE — Telephone Encounter (Signed)
 Pharmacy Patient Advocate Encounter  Received notification from [facility] that Prior Authorization for Modafinil  200MG  tablets  has been APPROVED from 11/23/23 to 11/22/24. Unable to obtain price due to refill too soon rejection, last fill date 11/23/23 next available fill date7/6/25   PA #/Case ID/Reference #: [id number]

## 2023-12-22 ENCOUNTER — Encounter: Payer: Self-pay | Admitting: Internal Medicine

## 2023-12-22 MED ORDER — EPINEPHRINE 0.3 MG/0.3ML IJ SOAJ
0.3000 mg | INTRAMUSCULAR | 3 refills | Status: DC | PRN
Start: 2023-12-22 — End: 2024-01-12

## 2023-12-22 MED ORDER — EPINEPHRINE 0.3 MG/0.3ML IJ SOAJ: 0.3000 mg | INTRAMUSCULAR | 3 refills | Status: DC | PRN

## 2023-12-22 NOTE — Telephone Encounter (Signed)
 Spoke with the pt and was able to inform him that I was able to send in the Epi-Pen as pt has had an allergic to [facility] that was mixed in with his smoothie while in [place] .

## 2023-12-22 NOTE — Addendum Note (Signed)
 Addended by: HEDDY IP R on: 12/22/2023 02:07 PM   Modules accepted: Orders

## 2023-12-24 ENCOUNTER — Other Ambulatory Visit: Payer: Self-pay | Admitting: Internal Medicine

## 2024-01-12 ENCOUNTER — Ambulatory Visit: Admitting: Internal Medicine

## 2024-01-12 ENCOUNTER — Other Ambulatory Visit: Payer: Self-pay | Admitting: Internal Medicine

## 2024-01-12 ENCOUNTER — Telehealth: Payer: Self-pay

## 2024-01-12 ENCOUNTER — Encounter: Payer: Self-pay | Admitting: Internal Medicine

## 2024-01-12 ENCOUNTER — Other Ambulatory Visit (HOSPITAL_COMMUNITY): Payer: Self-pay

## 2024-01-12 VITALS — BP 145/89 | HR 78 | Ht 76.0 in | Wt 269.0 lb

## 2024-01-12 DIAGNOSIS — E118 Type 2 diabetes mellitus with unspecified complications: Secondary | ICD-10-CM

## 2024-01-12 DIAGNOSIS — Z0000 Encounter for general adult medical examination without abnormal findings: Secondary | ICD-10-CM

## 2024-01-12 DIAGNOSIS — R202 Paresthesia of skin: Secondary | ICD-10-CM | POA: Diagnosis not present

## 2024-01-12 DIAGNOSIS — I10 Essential (primary) hypertension: Secondary | ICD-10-CM | POA: Diagnosis not present

## 2024-01-12 DIAGNOSIS — Z7985 Long-term (current) use of injectable non-insulin antidiabetic drugs: Secondary | ICD-10-CM

## 2024-01-12 DIAGNOSIS — E538 Deficiency of other specified B group vitamins: Secondary | ICD-10-CM

## 2024-01-12 LAB — HEMOGLOBIN A1C: Hgb A1c MFr Bld: 9.3 % — ABNORMAL HIGH (ref 4.6–6.5)

## 2024-01-12 LAB — CBC WITH DIFFERENTIAL/PLATELET
Basophils Absolute: 0 K/uL (ref 0.0–0.1)
Basophils Relative: 0.5 % (ref 0.0–3.0)
Eosinophils Absolute: 0.1 K/uL (ref 0.0–0.7)
Eosinophils Relative: 2.5 % (ref 0.0–5.0)
HCT: 47.3 % (ref 39.0–52.0)
Hemoglobin: 15.7 g/dL (ref 13.0–17.0)
Lymphocytes Relative: 34.8 % (ref 12.0–46.0)
Lymphs Abs: 2 K/uL (ref 0.7–4.0)
MCHC: 33.3 g/dL (ref 30.0–36.0)
MCV: 86.5 fl (ref 78.0–100.0)
Monocytes Absolute: 0.2 K/uL (ref 0.1–1.0)
Monocytes Relative: 4.3 % (ref 3.0–12.0)
Neutro Abs: 3.4 K/uL (ref 1.4–7.7)
Neutrophils Relative %: 57.9 % (ref 43.0–77.0)
Platelets: 274 K/uL (ref 150.0–400.0)
RBC: 5.47 Mil/uL (ref 4.22–5.81)
RDW: 13.6 % (ref 11.5–15.5)
WBC: 5.8 K/uL (ref 4.0–10.5)

## 2024-01-12 LAB — TSH: TSH: 0.92 u[IU]/mL (ref 0.35–5.50)

## 2024-01-12 LAB — COMPREHENSIVE METABOLIC PANEL WITH GFR
ALT: 23 U/L (ref 0–53)
AST: 14 U/L (ref 0–37)
Albumin: 4.8 g/dL (ref 3.5–5.2)
Alkaline Phosphatase: 64 U/L (ref 39–117)
BUN: 12 mg/dL (ref 6–23)
CO2: 26 meq/L (ref 19–32)
Calcium: 9.7 mg/dL (ref 8.4–10.5)
Chloride: 103 meq/L (ref 96–112)
Creatinine, Ser: 1.06 mg/dL (ref 0.40–1.50)
GFR: 82.71 mL/min (ref >60.00)
Glucose, Bld: 160 mg/dL — ABNORMAL HIGH (ref 70–99)
Potassium: 3.9 meq/L (ref 3.5–5.1)
Sodium: 139 meq/L (ref 135–145)
Total Bilirubin: 0.7 mg/dL (ref 0.2–1.2)
Total Protein: 7.9 g/dL (ref 6.0–8.3)

## 2024-01-12 LAB — LIPID PANEL
Cholesterol: 186 mg/dL (ref 0–200)
HDL: 60.3 mg/dL (ref >39.00)
LDL Cholesterol: 110 mg/dL — ABNORMAL HIGH (ref 0–99)
NonHDL: 125.75
Total CHOL/HDL Ratio: 3
Triglycerides: 81 mg/dL (ref 0.0–149.0)
VLDL: 16.2 mg/dL (ref 0.0–40.0)

## 2024-01-12 LAB — MICROALBUMIN / CREATININE URINE RATIO
Creatinine,U: 65.9 mg/dL
Microalb Creat Ratio: 32.8 mg/g — ABNORMAL HIGH (ref 0.0–30.0)
Microalb, Ur: 2.2 mg/dL — ABNORMAL HIGH (ref 0.0–1.9)

## 2024-01-12 LAB — PSA: PSA: 0.9 ng/mL (ref 0.10–4.00)

## 2024-01-12 MED ORDER — REPAGLINIDE 1 MG PO TABS: 1.0000 mg | ORAL_TABLET | Freq: Three times a day (TID) | ORAL | 11 refills | Status: AC

## 2024-01-12 MED ORDER — LEVOCETIRIZINE DIHYDROCHLORIDE 5 MG PO TABS: 5.0000 mg | ORAL_TABLET | Freq: Every evening | ORAL | 3 refills | Status: AC

## 2024-01-12 MED ORDER — DAPAGLIFLOZIN PRO-METFORMIN ER 10-1000 MG PO TB24: 1.0000 | ORAL_TABLET | Freq: Every day | ORAL | 3 refills | Status: AC

## 2024-01-12 MED ORDER — OMEPRAZOLE 40 MG PO CPDR: DELAYED_RELEASE_CAPSULE | ORAL | 3 refills | Status: DC

## 2024-01-12 MED ORDER — AMLODIPINE BESYLATE 10 MG PO TABS: 10.0000 mg | ORAL_TABLET | Freq: Every day | ORAL | 3 refills | Status: AC

## 2024-01-12 MED ORDER — TELMISARTAN-HCTZ 80-25 MG PO TABS: 1.0000 | ORAL_TABLET | Freq: Every day | ORAL | 3 refills | Status: AC

## 2024-01-12 MED ORDER — MONTELUKAST SODIUM 10 MG PO TABS: 10.0000 mg | ORAL_TABLET | Freq: Every day | ORAL | 3 refills | Status: DC

## 2024-01-12 MED ORDER — SEMAGLUTIDE (1 MG/DOSE) 4 MG/3ML ~~LOC~~ SOPN: 1.0000 mg | PEN_INJECTOR | SUBCUTANEOUS | 3 refills | Status: DC

## 2024-01-12 MED ORDER — MELOXICAM 15 MG PO TABS: 15.0000 mg | ORAL_TABLET | Freq: Every day | ORAL | 2 refills | Status: DC

## 2024-01-12 MED ORDER — MODAFINIL 200 MG PO TABS: 200.0000 mg | ORAL_TABLET | Freq: Every day | ORAL | 1 refills | Status: AC

## 2024-01-12 MED ORDER — EPINEPHRINE 0.3 MG/0.3ML IJ SOAJ: 0.3000 mg | INTRAMUSCULAR | 3 refills | Status: AC | PRN

## 2024-01-12 NOTE — Progress Notes (Signed)
 Subjective:  Patient ID: Robert Bohr Sr., male    DOB: [birth year]  Age: 48 y.o. MRN: [id number]  CC: Medical Management of Chronic Issues (Physical)   HPI Robert Bohr Sr. presents for a well exam C/o toes tingling  Outpatient Medications Prior to Visit  Medication Sig Dispense Refill   fluticasone  (FLONASE ) 50 MCG/ACT nasal spray USE 2 SPRAYS IN EACH NOSTRIL EVERY DAY 48 mL 3   hydrocortisone -pramoxine (PROCTOFOAM-HC) rectal foam Place 1 applicator rectally 2 (two) times daily. As needed.     amLODipine  (NORVASC ) 10 MG tablet TAKE 1 TABLET BY MOUTH DAILY. OVERDUE FOR FOLLOW-UP APPT MUST SEE PROVIDER FOR FUTURE REFILLS 90 tablet 3   EPINEPHrine  0.3 mg/0.3 mL IJ SOAJ injection Inject 0.3 mg into the muscle as needed for anaphylaxis. 1 each 3   levocetirizine (XYZAL ) 5 MG tablet Take 1 tablet (5 mg total) by mouth every evening. Overdue for follow-up appt must see provider for future refills 90 tablet 3   meloxicam  (MOBIC ) 15 MG tablet TAKE 1 TABLET BY MOUTH EVERY DAY AS NEEDED FOR PAIN 30 tablet 2   modafinil  (PROVIGIL ) 200 MG tablet TAKE 1 TABLET BY MOUTH EVERY DAY 90 tablet 1   montelukast  (SINGULAIR ) 10 MG tablet TAKE 1 TABLET BY MOUTH EVERY DAY 90 tablet 3   omeprazole  (PRILOSEC) 40 MG capsule PATIENT WILL NEED TO SCHEDULE AN OFFICE VISIT. 90 capsule 3   repaglinide  (PRANDIN ) 1 MG tablet Take 1 tablet (1 mg total) by mouth 3 (three) times daily before meals. 90 tablet 11   Semaglutide  (RYBELSUS ) 14 MG TABS Take 1 tablet (14 mg total) by mouth daily. 30 tablet 11   telmisartan -hydrochlorothiazide  (MICARDIS  HCT) 80-25 MG tablet TAKE 1 TABLET BY MOUTH EVERY DAY 90 tablet 3   XIGDUO  XR 03-999 MG TB24 TAKE 1 TABLET BY MOUTH EVERY DAY 90 tablet 3   No facility-administered medications prior to visit.    ROS: Review of Systems  Constitutional:  Negative for appetite change, fatigue and unexpected weight change.  HENT:  Negative for congestion, nosebleeds, sneezing, sore throat  and trouble swallowing.   Eyes:  Negative for itching and visual disturbance.  Respiratory:  Negative for cough.   Cardiovascular:  Negative for chest pain, palpitations and leg swelling.  Gastrointestinal:  Negative for abdominal distention, blood in stool, diarrhea and nausea.  Genitourinary:  Negative for frequency and hematuria.  Musculoskeletal:  Negative for back pain, gait problem, joint swelling and neck pain.  Skin:  Negative for rash.  Neurological:  Positive for numbness. Negative for dizziness, tremors, speech difficulty and weakness.  Psychiatric/Behavioral:  Negative for agitation, dysphoric mood and sleep disturbance. The patient is not nervous/anxious.     Objective:  BP (!) 145/89   Pulse 78   Ht 6' 4 (1.93 m)   Wt 269 lb (122 kg)   SpO2 99%   BMI 32.74 kg/m   BP Readings from Last 3 Encounters:  01/12/24 (!) 145/89  04/07/23 132/74  11/18/22 132/84    Wt Readings from Last 3 Encounters:  01/12/24 269 lb (122 kg)  04/07/23 276 lb 4 oz (125.3 kg)  11/18/22 276 lb (125.2 kg)    Physical Exam Constitutional:      General: He is not in acute distress.    Appearance: He is well-developed.     Comments: NAD  Eyes:     Conjunctiva/sclera: Conjunctivae normal.     Pupils: Pupils are equal, round, and reactive to light.  Neck:  Thyroid : No thyromegaly.     Vascular: No JVD.  Cardiovascular:     Rate and Rhythm: Normal rate and regular rhythm.     Heart sounds: Normal heart sounds. No murmur heard.    No friction rub. No gallop.  Pulmonary:     Effort: Pulmonary effort is normal. No respiratory distress.     Breath sounds: Normal breath sounds. No wheezing or rales.  Chest:     Chest wall: No tenderness.  Abdominal:     General: Bowel sounds are normal. There is no distension.     Palpations: Abdomen is soft. There is no mass.     Tenderness: There is no abdominal tenderness. There is no guarding or rebound.  Musculoskeletal:        General: No  tenderness. Normal range of motion.     Cervical back: Normal range of motion.     Right lower leg: No edema.     Left lower leg: No edema.  Lymphadenopathy:     Cervical: No cervical adenopathy.  Skin:    General: Skin is warm and dry.     Findings: No rash.  Neurological:     Mental Status: He is alert and oriented to person, place, and time.     Cranial Nerves: No cranial nerve deficit.     Motor: No abnormal muscle tone.     Coordination: Coordination normal.     Gait: Gait normal.     Deep Tendon Reflexes: Reflexes are normal and symmetric.  Psychiatric:        Behavior: Behavior normal.        Thought Content: Thought content normal.        Judgment: Judgment normal.   Pt declined rectal exam  Peripheral pulses: Lower extremities are normal bilaterally   Lab Results  Component Value Date   WBC 5.8 01/12/2024   HGB 15.7 01/12/2024   HCT 47.3 01/12/2024   PLT 274.0 01/12/2024   GLUCOSE 160 (H) 01/12/2024   CHOL 186 01/12/2024   TRIG 81.0 01/12/2024   HDL 60.30 01/12/2024   LDLCALC 110 (H) 01/12/2024   ALT 23 01/12/2024   AST 14 01/12/2024   NA 139 01/12/2024   K 3.9 01/12/2024   CL 103 01/12/2024   CREATININE 1.06 01/12/2024   BUN 12 01/12/2024   CO2 26 01/12/2024   TSH 0.92 01/12/2024   PSA 0.90 01/12/2024   INR 1.13 04/06/2009   HGBA1C 9.3 (H) 01/12/2024   MICROALBUR 2.2 (H) 01/12/2024    No results found.  Assessment & Plan:   Problem List Items Addressed This Visit     B12 deficiency   Vitamin B12      HTN (hypertension)   Relevant Medications   telmisartan -hydrochlorothiazide  (MICARDIS  HCT) 80-25 MG tablet   EPINEPHrine  0.3 mg/0.3 mL IJ SOAJ injection   amLODipine  (NORVASC ) 10 MG tablet   Paresthesia   Both feet.  Likely diabetic neuropathy.  Improved glucose control.  Continue to wear good shoes.      Type II diabetes mellitus with manifestations (HCC)   Discontinue Rybelsus  14 mg/d due to lack of weight loss/need for better diabetes  control Start Ozempic  1 mg weekly.      Relevant Medications   telmisartan -hydrochlorothiazide  (MICARDIS  HCT) 80-25 MG tablet   repaglinide  (PRANDIN ) 1 MG tablet   Dapagliflozin  Pro-metFORMIN  ER (XIGDUO  XR) 03-999 MG TB24   Semaglutide , 1 MG/DOSE, 4 MG/3ML SOPN   Other Relevant Orders   Hemoglobin A1c (Completed)  Microalbumin / creatinine urine ratio (Completed)   CBC with Differential/Platelet (Completed)   Well adult exam - Primary    We discussed age appropriate health related issues, including available/recomended screening tests and vaccinations. Labs were ordered to be later reviewed . All questions were answered. We discussed one or more of the following - seat belt use, use of sunscreen/sun exposure exercise, safe sex, fall risk reduction, second hand smoke exposure, firearm use and storage, seat belt use, a need for adhering to healthy diet and exercise. Labs were ordered.  All questions were answered. A cardiac CT scan for calcium  scoring offered Cologuard (-) 06/2023       Relevant Orders   Comprehensive metabolic panel with GFR (Completed)   Hemoglobin A1c (Completed)   Lipid panel (Completed)   Microalbumin / creatinine urine ratio (Completed)   PSA (Completed)   TSH (Completed)   Other Visit Diagnoses       Essential hypertension       Relevant Medications   telmisartan -hydrochlorothiazide  (MICARDIS  HCT) 80-25 MG tablet   EPINEPHrine  0.3 mg/0.3 mL IJ SOAJ injection   amLODipine  (NORVASC ) 10 MG tablet         Meds ordered this encounter  Medications   telmisartan -hydrochlorothiazide  (MICARDIS  HCT) 80-25 MG tablet    Sig: Take 1 tablet by mouth daily.    Dispense:  90 tablet    Refill:  3   EPINEPHrine  0.3 mg/0.3 mL IJ SOAJ injection    Sig: Inject 0.3 mg into the muscle as needed for anaphylaxis.    Dispense:  1 each    Refill:  3   amLODipine  (NORVASC ) 10 MG tablet    Sig: Take 1 tablet (10 mg total) by mouth daily.    Dispense:  90 tablet     Refill:  3   levocetirizine (XYZAL ) 5 MG tablet    Sig: Take 1 tablet (5 mg total) by mouth every evening. Overdue for follow-up appt must see provider for future refills    Dispense:  90 tablet    Refill:  3   meloxicam  (MOBIC ) 15 MG tablet    Sig: Take 1 tablet (15 mg total) by mouth daily.    Dispense:  30 tablet    Refill:  2   modafinil  (PROVIGIL ) 200 MG tablet    Sig: Take 1 tablet (200 mg total) by mouth daily.    Dispense:  90 tablet    Refill:  1    This request is for a new prescription for a controlled substance as required by Federal/State law.   montelukast  (SINGULAIR ) 10 MG tablet    Sig: Take 1 tablet (10 mg total) by mouth daily.    Dispense:  90 tablet    Refill:  3   DISCONTD: omeprazole  (PRILOSEC) 40 MG capsule    Sig: Patient will need to schedule an office visit.    Dispense:  90 capsule    Refill:  3   repaglinide  (PRANDIN ) 1 MG tablet    Sig: Take 1 tablet (1 mg total) by mouth 3 (three) times daily before meals.    Dispense:  90 tablet    Refill:  11   Dapagliflozin  Pro-metFORMIN  ER (XIGDUO  XR) 03-999 MG TB24    Sig: Take 1 tablet by mouth daily.    Dispense:  90 tablet    Refill:  3   Semaglutide , 1 MG/DOSE, 4 MG/3ML SOPN    Sig: Inject 1 mg as directed once a week.    Dispense:  3 mL    Refill:  3      Follow-up: Return in about 6 months (around 07/14/2024) for a follow-up visit.  Marolyn Noel, MD

## 2024-01-12 NOTE — Telephone Encounter (Signed)
 Pharmacy Patient Advocate Encounter   Received notification from CoverMyMeds that prior authorization for Ozempic (1 MG/DOSE) 4MG /3ML pen-injectors is required/requested.   Insurance verification completed.   The patient is insured through [facility] .   Per test claim: PA required; PA submitted to above mentioned insurance via CoverMyMeds Key/confirmation #/EOC [id number] Status is pending

## 2024-01-12 NOTE — Telephone Encounter (Signed)
 Pharmacy Patient Advocate Encounter  Received notification from [facility] that Prior Authorization for Ozempic (1 MG/DOSE) 4MG /3ML pen-injectorshas been APPROVED from 01/12/24 to 01/12/27   PA #/Case ID/Reference #: [id number] TH

## 2024-01-12 NOTE — Assessment & Plan Note (Signed)
  We discussed age appropriate health related issues, including available/recomended screening tests and vaccinations. Labs were ordered to be later reviewed . All questions were answered. We discussed one or more of the following - seat belt use, use of sunscreen/sun exposure exercise, safe sex, fall risk reduction, second hand smoke exposure, firearm use and storage, seat belt use, a need for adhering to healthy diet and exercise. Labs were ordered.  All questions were answered. A cardiac CT scan for calcium  scoring offered Cologuard (-) 06/2023

## 2024-01-16 ENCOUNTER — Ambulatory Visit: Payer: Self-pay | Admitting: Internal Medicine

## 2024-01-21 DIAGNOSIS — R202 Paresthesia of skin: Secondary | ICD-10-CM | POA: Insufficient documentation

## 2024-01-21 NOTE — Assessment & Plan Note (Signed)
 Both feet.  Likely diabetic neuropathy.  Improved glucose control.  Continue to wear good shoes.

## 2024-01-21 NOTE — Assessment & Plan Note (Signed)
 Discontinue Rybelsus  14 mg/d due to lack of weight loss/need for better diabetes control Start Ozempic  1 mg weekly.

## 2024-01-21 NOTE — Assessment & Plan Note (Signed)
 Vitamin B12

## 2024-02-06 ENCOUNTER — Encounter: Payer: Self-pay | Admitting: Internal Medicine

## 2024-02-08 ENCOUNTER — Other Ambulatory Visit: Payer: Self-pay

## 2024-02-08 MED ORDER — MONTELUKAST SODIUM 10 MG PO TABS: 10.0000 mg | ORAL_TABLET | Freq: Every day | ORAL | 3 refills | Status: AC

## 2024-03-23 ENCOUNTER — Other Ambulatory Visit: Payer: Self-pay | Admitting: Internal Medicine

## 2024-05-22 ENCOUNTER — Other Ambulatory Visit: Payer: Self-pay | Admitting: Internal Medicine

## 2024-05-24 ENCOUNTER — Other Ambulatory Visit: Payer: Self-pay | Admitting: Internal Medicine

## 2024-12-19 ENCOUNTER — Encounter: Admitting: Internal Medicine

## 2025-01-16 ENCOUNTER — Encounter: Admitting: Internal Medicine
